# Patient Record
Sex: Female | Born: 1945
Health system: Southern US, Community
[De-identification: ages and names within clinical notes are randomized; demographics above are authoritative.]

## PROBLEM LIST (undated history)

## (undated) DIAGNOSIS — F329 Major depressive disorder, single episode, unspecified: Secondary | ICD-10-CM

## (undated) DIAGNOSIS — R51 Headache: Secondary | ICD-10-CM

## (undated) DIAGNOSIS — F32A Depression, unspecified: Secondary | ICD-10-CM

## (undated) DIAGNOSIS — M1990 Unspecified osteoarthritis, unspecified site: Secondary | ICD-10-CM

## (undated) DIAGNOSIS — K219 Gastro-esophageal reflux disease without esophagitis: Secondary | ICD-10-CM

## (undated) DIAGNOSIS — E039 Hypothyroidism, unspecified: Secondary | ICD-10-CM

## (undated) DIAGNOSIS — M797 Fibromyalgia: Secondary | ICD-10-CM

## (undated) DIAGNOSIS — F419 Anxiety disorder, unspecified: Secondary | ICD-10-CM

## (undated) DIAGNOSIS — R32 Unspecified urinary incontinence: Secondary | ICD-10-CM

## (undated) DIAGNOSIS — M549 Dorsalgia, unspecified: Secondary | ICD-10-CM

## (undated) HISTORY — PX: FRACTURE SURGERY: SHX138

## (undated) HISTORY — PX: TOTAL KNEE ARTHROPLASTY: SHX125

## (undated) HISTORY — PX: JOINT REPLACEMENT: SHX530

## (undated) HISTORY — PX: BREAST CYST ASPIRATION: SHX578

## (undated) HISTORY — PX: BREAST CYST EXCISION: SHX579

---

## 1994-02-12 HISTORY — PX: VAGINAL HYSTERECTOMY: SUR661

## 1997-08-12 ENCOUNTER — Emergency Department (HOSPITAL_COMMUNITY): Admission: EM | Admit: 1997-08-12 | Discharge: 1997-08-12 | Payer: Self-pay | Admitting: Emergency Medicine

## 1997-09-08 ENCOUNTER — Ambulatory Visit (HOSPITAL_COMMUNITY): Admission: RE | Admit: 1997-09-08 | Discharge: 1997-09-08 | Payer: Self-pay | Admitting: *Deleted

## 1997-11-17 ENCOUNTER — Ambulatory Visit: Admission: RE | Admit: 1997-11-17 | Discharge: 1997-11-17 | Payer: Self-pay

## 1997-11-19 ENCOUNTER — Emergency Department (HOSPITAL_COMMUNITY): Admission: EM | Admit: 1997-11-19 | Discharge: 1997-11-19 | Payer: Self-pay | Admitting: Emergency Medicine

## 1998-03-14 ENCOUNTER — Encounter: Payer: Self-pay | Admitting: *Deleted

## 1998-03-14 ENCOUNTER — Ambulatory Visit (HOSPITAL_COMMUNITY): Admission: RE | Admit: 1998-03-14 | Discharge: 1998-03-14 | Payer: Self-pay | Admitting: *Deleted

## 1999-02-12 ENCOUNTER — Encounter: Payer: Self-pay | Admitting: Emergency Medicine

## 1999-02-12 ENCOUNTER — Emergency Department (HOSPITAL_COMMUNITY): Admission: EM | Admit: 1999-02-12 | Discharge: 1999-02-12 | Payer: Self-pay | Admitting: Emergency Medicine

## 1999-02-23 ENCOUNTER — Encounter (INDEPENDENT_AMBULATORY_CARE_PROVIDER_SITE_OTHER): Payer: Self-pay | Admitting: Specialist

## 1999-02-23 ENCOUNTER — Ambulatory Visit (HOSPITAL_COMMUNITY): Admission: RE | Admit: 1999-02-23 | Discharge: 1999-02-23 | Payer: Self-pay | Admitting: *Deleted

## 1999-03-23 ENCOUNTER — Encounter: Payer: Self-pay | Admitting: Orthopedic Surgery

## 1999-03-23 ENCOUNTER — Ambulatory Visit (HOSPITAL_COMMUNITY): Admission: RE | Admit: 1999-03-23 | Discharge: 1999-03-23 | Payer: Self-pay | Admitting: Orthopedic Surgery

## 1999-06-30 ENCOUNTER — Encounter: Payer: Self-pay | Admitting: *Deleted

## 1999-06-30 ENCOUNTER — Encounter: Admission: RE | Admit: 1999-06-30 | Discharge: 1999-06-30 | Payer: Self-pay | Admitting: *Deleted

## 1999-12-21 ENCOUNTER — Ambulatory Visit (HOSPITAL_COMMUNITY): Admission: RE | Admit: 1999-12-21 | Discharge: 1999-12-21 | Payer: Self-pay | Admitting: Neurosurgery

## 1999-12-21 ENCOUNTER — Encounter: Payer: Self-pay | Admitting: Neurosurgery

## 2000-06-14 ENCOUNTER — Encounter: Payer: Self-pay | Admitting: Emergency Medicine

## 2000-06-14 ENCOUNTER — Emergency Department (HOSPITAL_COMMUNITY): Admission: EM | Admit: 2000-06-14 | Discharge: 2000-06-14 | Payer: Self-pay | Admitting: Emergency Medicine

## 2000-06-20 ENCOUNTER — Encounter: Admission: RE | Admit: 2000-06-20 | Discharge: 2000-06-20 | Payer: Self-pay | Admitting: Gastroenterology

## 2000-06-20 ENCOUNTER — Encounter: Payer: Self-pay | Admitting: Gastroenterology

## 2001-01-23 ENCOUNTER — Encounter: Payer: Self-pay | Admitting: Neurosurgery

## 2001-01-23 ENCOUNTER — Encounter: Admission: RE | Admit: 2001-01-23 | Discharge: 2001-01-23 | Payer: Self-pay | Admitting: Neurosurgery

## 2001-02-03 ENCOUNTER — Encounter: Payer: Self-pay | Admitting: Neurosurgery

## 2001-02-03 ENCOUNTER — Encounter: Admission: RE | Admit: 2001-02-03 | Discharge: 2001-02-03 | Payer: Self-pay | Admitting: Neurosurgery

## 2001-02-17 ENCOUNTER — Encounter: Admission: RE | Admit: 2001-02-17 | Discharge: 2001-02-17 | Payer: Self-pay | Admitting: Neurosurgery

## 2001-02-17 ENCOUNTER — Encounter: Payer: Self-pay | Admitting: Neurosurgery

## 2001-04-10 ENCOUNTER — Ambulatory Visit (HOSPITAL_COMMUNITY): Admission: RE | Admit: 2001-04-10 | Discharge: 2001-04-10 | Payer: Self-pay | Admitting: Gastroenterology

## 2001-04-10 ENCOUNTER — Encounter (INDEPENDENT_AMBULATORY_CARE_PROVIDER_SITE_OTHER): Payer: Self-pay | Admitting: Specialist

## 2001-12-11 ENCOUNTER — Ambulatory Visit: Admission: RE | Admit: 2001-12-11 | Discharge: 2001-12-11 | Payer: Self-pay | Admitting: Orthopedic Surgery

## 2002-12-28 ENCOUNTER — Inpatient Hospital Stay (HOSPITAL_COMMUNITY): Admission: RE | Admit: 2002-12-28 | Discharge: 2002-12-31 | Payer: Self-pay | Admitting: Orthopedic Surgery

## 2003-02-05 ENCOUNTER — Emergency Department (HOSPITAL_COMMUNITY): Admission: EM | Admit: 2003-02-05 | Discharge: 2003-02-05 | Payer: Self-pay | Admitting: *Deleted

## 2003-03-04 ENCOUNTER — Ambulatory Visit (HOSPITAL_COMMUNITY): Admission: RE | Admit: 2003-03-04 | Discharge: 2003-03-04 | Payer: Self-pay | Admitting: Orthopedic Surgery

## 2004-02-13 HISTORY — PX: BACK SURGERY: SHX140

## 2004-02-13 HISTORY — PX: LUMBAR DISC SURGERY: SHX700

## 2004-06-20 ENCOUNTER — Encounter: Admission: RE | Admit: 2004-06-20 | Discharge: 2004-06-20 | Payer: Self-pay | Admitting: Orthopedic Surgery

## 2004-07-04 ENCOUNTER — Encounter: Admission: RE | Admit: 2004-07-04 | Discharge: 2004-07-04 | Payer: Self-pay | Admitting: Orthopedic Surgery

## 2004-07-26 ENCOUNTER — Encounter: Admission: RE | Admit: 2004-07-26 | Discharge: 2004-07-26 | Payer: Self-pay | Admitting: Orthopedic Surgery

## 2004-12-26 ENCOUNTER — Ambulatory Visit (HOSPITAL_COMMUNITY): Admission: RE | Admit: 2004-12-26 | Discharge: 2004-12-27 | Payer: Self-pay | Admitting: Neurosurgery

## 2006-01-17 ENCOUNTER — Emergency Department (HOSPITAL_COMMUNITY): Admission: EM | Admit: 2006-01-17 | Discharge: 2006-01-18 | Payer: Self-pay | Admitting: Emergency Medicine

## 2006-05-31 ENCOUNTER — Encounter: Admission: RE | Admit: 2006-05-31 | Discharge: 2006-05-31 | Payer: Self-pay | Admitting: Orthopaedic Surgery

## 2006-06-25 ENCOUNTER — Ambulatory Visit (HOSPITAL_BASED_OUTPATIENT_CLINIC_OR_DEPARTMENT_OTHER): Admission: RE | Admit: 2006-06-25 | Discharge: 2006-06-25 | Payer: Self-pay | Admitting: Orthopaedic Surgery

## 2006-08-22 ENCOUNTER — Encounter: Admission: RE | Admit: 2006-08-22 | Discharge: 2006-08-22 | Payer: Self-pay | Admitting: Orthopaedic Surgery

## 2008-02-13 DIAGNOSIS — M1990 Unspecified osteoarthritis, unspecified site: Secondary | ICD-10-CM

## 2008-02-13 HISTORY — DX: Unspecified osteoarthritis, unspecified site: M19.90

## 2008-04-20 ENCOUNTER — Inpatient Hospital Stay (HOSPITAL_COMMUNITY): Admission: RE | Admit: 2008-04-20 | Discharge: 2008-04-23 | Payer: Self-pay | Admitting: Orthopaedic Surgery

## 2008-04-21 ENCOUNTER — Ambulatory Visit: Payer: Self-pay | Admitting: Physical Medicine & Rehabilitation

## 2009-02-16 ENCOUNTER — Encounter: Admission: RE | Admit: 2009-02-16 | Discharge: 2009-02-16 | Payer: Self-pay | Admitting: Neurosurgery

## 2009-04-21 ENCOUNTER — Encounter
Admission: RE | Admit: 2009-04-21 | Discharge: 2009-04-21 | Payer: Self-pay | Admitting: Physical Medicine & Rehabilitation

## 2009-05-06 ENCOUNTER — Inpatient Hospital Stay (HOSPITAL_COMMUNITY): Admission: RE | Admit: 2009-05-06 | Discharge: 2009-05-11 | Payer: Self-pay | Admitting: Neurosurgery

## 2009-09-16 ENCOUNTER — Encounter: Admission: RE | Admit: 2009-09-16 | Discharge: 2009-09-16 | Payer: Self-pay | Admitting: Rheumatology

## 2009-12-01 ENCOUNTER — Encounter: Admission: RE | Admit: 2009-12-01 | Discharge: 2009-12-01 | Payer: Self-pay | Admitting: Orthopedic Surgery

## 2010-03-05 ENCOUNTER — Encounter: Payer: Self-pay | Admitting: Rheumatology

## 2010-05-08 LAB — CBC
HCT: 38 % (ref 36.0–46.0)
Hemoglobin: 12.7 g/dL (ref 12.0–15.0)
MCHC: 33.3 g/dL (ref 30.0–36.0)
MCV: 87.3 fL (ref 78.0–100.0)
Platelets: 280 10*3/uL (ref 150–400)
RBC: 4.35 MIL/uL (ref 3.87–5.11)
RDW: 17.8 % — ABNORMAL HIGH (ref 11.5–15.5)
WBC: 7.4 10*3/uL (ref 4.0–10.5)

## 2010-05-08 LAB — BASIC METABOLIC PANEL
BUN: 11 mg/dL (ref 6–23)
CO2: 29 mEq/L (ref 19–32)
Calcium: 8.9 mg/dL (ref 8.4–10.5)
Chloride: 107 mEq/L (ref 96–112)
Creatinine, Ser: 0.69 mg/dL (ref 0.4–1.2)
GFR calc Af Amer: >60 mL/min (ref >60)
GFR calc non Af Amer: >60 mL/min (ref >60)
Glucose, Bld: 108 mg/dL — ABNORMAL HIGH (ref 70–99)
Potassium: 4.7 mEq/L (ref 3.5–5.1)
Sodium: 142 mEq/L (ref 135–145)

## 2010-05-08 LAB — TYPE AND SCREEN
ABO/RH(D): O POS
Antibody Screen: NEGATIVE

## 2010-05-08 LAB — SURGICAL PCR SCREEN: MRSA, PCR: NEGATIVE

## 2010-05-08 LAB — ABO/RH: ABO/RH(D): O POS

## 2010-05-12 ENCOUNTER — Other Ambulatory Visit: Payer: Self-pay | Admitting: Gastroenterology

## 2010-05-25 LAB — CBC
HCT: 31.8 % — ABNORMAL LOW (ref 36.0–46.0)
Hemoglobin: 10.6 g/dL — ABNORMAL LOW (ref 12.0–15.0)
MCHC: 35 g/dL (ref 30.0–36.0)
Platelets: 192 10*3/uL (ref 150–400)
Platelets: 283 10*3/uL (ref 150–400)
RBC: 3.25 MIL/uL — ABNORMAL LOW (ref 3.87–5.11)
RBC: 3.62 MIL/uL — ABNORMAL LOW (ref 3.87–5.11)
WBC: 10.1 10*3/uL (ref 4.0–10.5)
WBC: 10.7 10*3/uL — ABNORMAL HIGH (ref 4.0–10.5)
WBC: 6.9 10*3/uL (ref 4.0–10.5)
WBC: 7.5 10*3/uL (ref 4.0–10.5)

## 2010-05-25 LAB — BASIC METABOLIC PANEL
BUN: 12 mg/dL (ref 6–23)
BUN: 4 mg/dL — ABNORMAL LOW (ref 6–23)
CO2: 26 mEq/L (ref 19–32)
CO2: 28 mEq/L (ref 19–32)
Calcium: 8.1 mg/dL — ABNORMAL LOW (ref 8.4–10.5)
Calcium: 9.2 mg/dL (ref 8.4–10.5)
Chloride: 100 mEq/L (ref 96–112)
Creatinine, Ser: 0.83 mg/dL (ref 0.4–1.2)
GFR calc Af Amer: >60 mL/min (ref >60)
GFR calc Af Amer: >60 mL/min (ref >60)
GFR calc non Af Amer: >60 mL/min (ref >60)
GFR calc non Af Amer: >60 mL/min (ref >60)
GFR calc non Af Amer: >60 mL/min (ref >60)
Glucose, Bld: 98 mg/dL (ref 70–99)
Potassium: 4.1 mEq/L (ref 3.5–5.1)
Potassium: 5.1 mEq/L (ref 3.5–5.1)
Sodium: 134 mEq/L — ABNORMAL LOW (ref 135–145)
Sodium: 137 mEq/L (ref 135–145)
Sodium: 138 mEq/L (ref 135–145)

## 2010-05-25 LAB — PROTIME-INR
INR: 1 (ref 0.00–1.49)
INR: 1.6 — ABNORMAL HIGH (ref 0.00–1.49)
INR: 2.4 — ABNORMAL HIGH (ref 0.00–1.49)
Prothrombin Time: 13 seconds (ref 11.6–15.2)
Prothrombin Time: 13.4 seconds (ref 11.6–15.2)
Prothrombin Time: 19.7 seconds — ABNORMAL HIGH (ref 11.6–15.2)

## 2010-05-31 ENCOUNTER — Ambulatory Visit: Payer: Self-pay | Admitting: Physical Therapy

## 2010-06-07 ENCOUNTER — Ambulatory Visit: Payer: Medicare Other | Attending: Orthopedic Surgery | Admitting: Physical Therapy

## 2010-06-07 DIAGNOSIS — M25673 Stiffness of unspecified ankle, not elsewhere classified: Secondary | ICD-10-CM | POA: Insufficient documentation

## 2010-06-07 DIAGNOSIS — M25676 Stiffness of unspecified foot, not elsewhere classified: Secondary | ICD-10-CM | POA: Insufficient documentation

## 2010-06-07 DIAGNOSIS — IMO0001 Reserved for inherently not codable concepts without codable children: Secondary | ICD-10-CM | POA: Insufficient documentation

## 2010-06-07 DIAGNOSIS — R269 Unspecified abnormalities of gait and mobility: Secondary | ICD-10-CM | POA: Insufficient documentation

## 2010-06-07 DIAGNOSIS — M25579 Pain in unspecified ankle and joints of unspecified foot: Secondary | ICD-10-CM | POA: Insufficient documentation

## 2010-06-08 ENCOUNTER — Ambulatory Visit: Payer: Medicare Other | Admitting: Physical Therapy

## 2010-06-12 ENCOUNTER — Ambulatory Visit: Payer: Medicare Other | Admitting: Physical Therapy

## 2010-06-14 ENCOUNTER — Ambulatory Visit: Payer: Medicare Other | Attending: Orthopedic Surgery | Admitting: Physical Therapy

## 2010-06-14 DIAGNOSIS — IMO0001 Reserved for inherently not codable concepts without codable children: Secondary | ICD-10-CM | POA: Insufficient documentation

## 2010-06-14 DIAGNOSIS — M25676 Stiffness of unspecified foot, not elsewhere classified: Secondary | ICD-10-CM | POA: Insufficient documentation

## 2010-06-14 DIAGNOSIS — M25673 Stiffness of unspecified ankle, not elsewhere classified: Secondary | ICD-10-CM | POA: Insufficient documentation

## 2010-06-14 DIAGNOSIS — M25579 Pain in unspecified ankle and joints of unspecified foot: Secondary | ICD-10-CM | POA: Insufficient documentation

## 2010-06-14 DIAGNOSIS — R269 Unspecified abnormalities of gait and mobility: Secondary | ICD-10-CM | POA: Insufficient documentation

## 2010-06-16 ENCOUNTER — Ambulatory Visit: Payer: Medicare Other | Admitting: Rehabilitation

## 2010-06-19 ENCOUNTER — Ambulatory Visit: Payer: Medicare Other | Admitting: Physical Therapy

## 2010-06-21 ENCOUNTER — Ambulatory Visit: Payer: Medicare Other | Admitting: Physical Therapy

## 2010-06-22 ENCOUNTER — Ambulatory Visit: Payer: Medicare Other | Admitting: Rehabilitation

## 2010-06-23 ENCOUNTER — Encounter: Payer: BC Managed Care – HMO | Admitting: Rehabilitation

## 2010-06-26 ENCOUNTER — Ambulatory Visit: Payer: Medicare Other | Admitting: Physical Therapy

## 2010-06-27 NOTE — Discharge Summary (Signed)
[name], [name]             ACCOUNT NO.:  [id number]   MEDICAL RECORD NO.:  [id number]          PATIENT TYPE:  INP   LOCATION:  5015                         FACILITY:  [facility]   PHYSICIAN:  Lubertha Basque. Dalldorf, M.D.DATE OF BIRTH:  1945/12/20   DATE OF ADMISSION:  04/20/2008  DATE OF DISCHARGE:  04/23/2008                               DISCHARGE SUMMARY   ADMITTING DIAGNOSES:  1. Left knee end-stage degenerative joint disease.  2  Gastroesophageal reflux disease.  1. Hypothyroidism.   DISCHARGE DIAGNOSES:  1. Left knee end-stage degenerative joint disease.  2. Gastroesophageal reflux disease.  3. Hypothyroidism.   OPERATIONS:  Left total knee replacement.   BRIEF HISTORY:  Ms. Lisa Duke is a 65 year old white female patient well  known to our practice who has had increasing left knee pain.  She is now  having pain with every step, trouble sleeping at nighttime, and trouble  being comfortable.  She has failed anti-inflammatory medications and  nonsteroidal anti-inflammatory injections, and her x-rays reveal end-  stage DJD.   PERTINENT LABORATORY AND X-RAY FINDINGS:  Lab work:  Hemoglobin 10.6,  hematocrit of 30.5, WBC is 6.9, and platelets 201.  Sodium 137,  potassium 3.6, BUN 4, creatinine 0.59, and glucose 101.  Last INR 2.4.   COURSE IN THE HOSPITAL:  She was admitted postoperatively, placed on a  variety of p.o. and IM analgesics for pain, a reduced dose PCA Dilaudid  pump was used.  She was also given IV Ancef a gram q.8 h. x3 doses and  advanced her diet slowly and regular.  Coumadin and Lovenox protocol per  Pharmacy.  DVT prophylaxis.  Then additional p.o. medicines, pain  medicines, iron supplementation, and stool softeners.  Knee high TEDs,  incentive spirometry, and then increased activity with therapy.  Weightbearing as tolerated were also incorporated.  She was kept on her  home medicines, which will be outlined at the end of this dictation.  CPM machine was  also used, 0-15 and advanced as tolerated.   The first day postoperative, blood pressure was 116/68, temperature 97.  Lungs were clear.  Abdomen was soft.  Left knee was within normal  limits.  Hemoglobin 12.0.  On the second day postoperative, dressing was  changed, drained was pulled, and the wound was noted to be benign  without signs of infection.  She was working well with therapy,  progressed, and was discharged the third day postop.  Condition on  discharge is improved.   FOLLOWUP:  She will see Dr. Jerl Santos in 2 weeks for staple removal.  May  change her dressing daily.  Low-sodium and heart-healthy diet.  Weightbearing as tolerated with crutches or walker.  Home agency to  provide home therapy, a CPM machine, and INRs for Coumadin dosing.  She  was given a prescription for Percocet 1-2 every 4-6  hours for pain, and will be kept on her home medicines, which are  Protonix 80 mg 1 a day, Prozac 40 mg 1 a day, Oxytrol 3.9 patch daily,  Sectral 400 mg nightly, Pamelor 50 mg nightly, Desyrel 150 mg nightly,  and Klonopin  2 mg t.i.d.      Lindwood Qua, P.A.      Lubertha Basque Jerl Santos, M.D.  Electronically Signed    MC/MEDQ  D:  04/23/2008  T:  04/23/2008  Job:  [id number]
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
## 2010-06-27 NOTE — Op Note (Signed)
Lisa Duke, Lisa Duke             ACCOUNT NO.:  [id number]   MEDICAL RECORD NO.:  [id number]          PATIENT TYPE:  INP   LOCATION:  5015                         FACILITY:  [facility]   PHYSICIAN:  Lubertha Basque. Dalldorf, M.D.DATE OF BIRTH:  1945/04/05   DATE OF PROCEDURE:  04/20/2008  DATE OF DISCHARGE:                               OPERATIVE REPORT   PREOPERATIVE DIAGNOSIS:  Left knee degenerative arthritis.   POSTOPERATIVE DIAGNOSIS:  Left knee degenerative arthritis.   PROCEDURE:  Left total knee replacement.   ANESTHESIA:  General and block.   ATTENDING SURGEON:  Lubertha Basque. Jerl Santos, MD   ASSISTANT:  Lindwood Qua, PA   INDICATIONS FOR PROCEDURE:  The patient is a 65 year old woman with a  long history of left knee pain.  This has persisted despite oral anti-  inflammatories and injections.  She has bone-on-bone degeneration seen  on x-ray and pain which limits her ability to walk and rest.  She is  offered a knee replacement operation.  She is status post a successful  procedure on the opposite side many years back.  Informed operative  consent was obtained after discussion of possible complications  including reaction to anesthesia, infection, DVT, PE, and death.   SUMMARY OF FINDINGS AND PROCEDURE:  Under general anesthesia and a knee  block, a left knee replacement was performed.  She had advanced  degenerative change but good bone quality.  We addressed her problem  with a cemented DePuy LCS system using standard plus femur, 4 tibia, 10-  mm deep dish spacer, and 35-mm all polyethylene patella.  These were  parts roughly the same as the opposite side.  We did include Zinacef  antibiotic in the cement.  Bryna Colander assisted throughout and was  invaluable to the completion of the case in that he helped position and  retract so I could perform the procedure.  He also closed simultaneously  to help minimize OR time.   DESCRIPTION OF PROCEDURE:  The patient was taken to the  operating suite  where general anesthetic was applied without difficulty.  She was also  given a block in the preanesthesia area.  She was positioned supine and  prepped and draped in normal sterile fashion.  After the administration  of IV Kefzol, the left leg was elevated, exsanguinated, and the  tourniquet inflated about the thigh.  A longitudinal anterior incision  was made with dissection down to the extensor mechanism.  All  appropriate anti-infected measures were used including closed hooded  exhaust systems for each member of the surgical team, Betadine  impregnated drape, and the preoperative IV antibiotic.  A medial  parapatellar incision was made.  The kneecap was flipped and the knee  was flexed.  Some residual meniscal tissues in the ACL and PCL were  removed.  An extramedullary guide was placed on the tibia and utilized  to make a cut with a slight posterior tilt.  An intramedullary guide was  then placed in the femur to make anterior-posterior cuts creating a  flexion gap of 10 mm.  A second intramedullary guide was placed in the  femur to make a distal cut creating an equal extension gap of 10 mm  balancing the knee.  No significant soft tissue release was required.  The femur sized to a standard plus and the tibia to a 4 with appropriate  guides placed and utilized.  The patella was cut down in thickness by 10  mm to 15 and sized to a 35 with the appropriate guide placed and  utilized.  A trial reduction was done with these components and a 10 mm  spacer.  She easily came to slight hyperextension and flexed well.  The  patella tracked well.  The trial components removed followed by  pulsatile lavage irrigation of all three cut bony surfaces.  Cement was  mixed including Zinacef antibiotic and was pressurized onto the bones  followed by placement of the aforementioned DePuy LCS components.  Excess cement was trimmed and pressure was held in the components until  the cement  had hardened.  The tourniquet was deflated and a small amount  of bleeding was easily controlled with Bovie cautery.  The knee was  irrigated followed by placement of a drain exiting superolaterally.  The  extensor mechanism was reapproximated with #1 Vicryl in an interrupted  fashion followed by subcutaneous reapproximation with 0 and 2-0 undyed  Vicryl.  Skin was closed with staples.  Adaptic was applied followed by  dry gauze and a loose Ace wrap.  Estimated blood loss was minimal and  intraoperative fluids can be obtained from anesthesia records as can  accurate tourniquet time which was under 1 hour.   DISPOSITION:  The patient was extubated in the operating room and taken  to recovery room in stable addition.  She was to be admitted to the  Orthopedic Surgery Service for appropriate postop care to include  perioperative antibiotics and Coumadin plus Lovenox for DVT prophylaxis.      Lubertha Basque Jerl Santos, M.D.  Electronically Signed     PGD/MEDQ  D:  04/20/2008  T:  04/21/2008  Job:  [id number]

## 2010-06-28 ENCOUNTER — Ambulatory Visit: Payer: Medicare Other | Admitting: Physical Therapy

## 2010-06-29 ENCOUNTER — Ambulatory Visit: Payer: Medicare Other | Admitting: Physical Therapy

## 2010-06-30 ENCOUNTER — Encounter: Payer: BC Managed Care – HMO | Admitting: Physical Therapy

## 2010-06-30 ENCOUNTER — Encounter: Payer: BC Managed Care – HMO | Admitting: Rehabilitation

## 2010-06-30 NOTE — Op Note (Signed)
NAMECLAIR, Lisa Duke             ACCOUNT NO.:  [id number]   MEDICAL RECORD NO.:  [id number]          PATIENT TYPE:  AMB   LOCATION:  SDS                          FACILITY:  [facility]   PHYSICIAN:  Hilda Lias, M.D.   DATE OF BIRTH:  20-Nov-1945   DATE OF PROCEDURE:  12/26/2004  DATE OF DISCHARGE:                                 OPERATIVE REPORT   PREOPERATIVE DIAGNOSES:  1.  Right L2-L3 herniated disc with extraforaminal component.  2.  Degenerative disc disease 4-5, 5-1.   POSTOPERATIVE DIAGNOSES:  1.  Right L2-L3 herniated disc with extraforaminal component.  2.  Degenerative disc disease 4-5, 5-1.   PROCEDURES:  1.  Right L2-L3 extraforaminal decompression.  2.  Discectomy.  3.  Decompression of the L2 nerve root.  4.  Microscope.   SURGEON:  Hilda Lias, M.D.   ASSISTANT:  Payton Doughty, M.D.   CLINICAL HISTORY:  The patient was admitted because of back and right leg  pain.  The pain mostly was localized to the thigh.  Patient had burning  sensation in that area.  MRI showed degenerative disc disease but at the  level 2-3 she has a large herniated disc extraforaminal.  Surgery was  advised and the risks were explained in the history and physical.   DESCRIPTION OF PROCEDURE:  The patient was taken to the OR and she was  positioned in prone manner.  The back was prepped.  X-rays showed that we  were in the lower part of L3.  From then on, incision was made at the level  of L2-L3.  Muscle was retracted laterally.  The first x-ray showed that we  were at the level of L1-L2.  From then on, we went extraforaminal laterally  and we found the L2-L3 space on the right side.  We drilled the lower  transverse process of L2, the upper of L3 and followed the medial facet.  With the microscope, we removed the intertransverse ligament.  Immediately  we found that the nerve was yellow, really white, and attached to the disc.  It took [place] about 10 minutes to make the space between the  disc and the  nerve.  At the end, we were able to retract the nerve and we removed not  only the extraforaminal of the component of the disc but also the  intraforaminal.  Total discectomy from the lateral approach was accomplished  with plenty of room for the L2.  At the end, the nerve came  back to be cylinder-like.  __________ was negative.  After having good  decompression, the area was irrigated.  Valsalva maneuver was negative.  Fentanyl and Depo-Medrol were left in the epidural space and the wound was  closed with Vicryl __________.           ______________________________  Hilda Lias, M.D.     EB/MEDQ  D:  12/26/2004  T:  12/26/2004  Job:  [id number]

## 2010-06-30 NOTE — Procedures (Signed)
[facility]  Patient:    Lisa Duke, Lisa Duke Visit Number: [id number] MRN: [id number]          Service Type: END Location: ENDO Attending Physician:  Nelda Marseille Dictated by:   Petra Kuba, M.D. Proc. Date: 04/10/01 Admit Date:  04/10/2001   CC:         Al Decant. Janey Greaser, M.D.  Maretta Bees. Vonita Moss, M.D.   Procedure Report  PROCEDURE:  Colonoscopy with polypectomy.  INDICATIONS FOR PROCEDURE:  Family history of colon cancer, colon polyps.  Consent was signed after risks, benefits, methods, and options were thoroughly discussed in the office.  MEDICINES USED:  Demerol 120, Versed 12.  DESCRIPTION OF PROCEDURE:  Rectal inspection was pertinent for external hemorrhoids, small. Digital exam was negative. The video pediatric adjustable colonoscope was inserted and fairly easily advanced around the colon to the cecum. This did require rolling her on her back and some abdominal pressure. On insertion, some left sided diverticula were seen but no other abnormalities. The cecum was identified by the appendiceal orifice and the ileocecal valve. The prep was adequate. There was some liquid stool that required washing and suctioning. The scope was slowly withdrawn. The cecum, ascending and transverse were normal. In the descending and two in the distal sigmoid tiny polyps were seen and were all hot biopsied x1 and put in the same container. There was an occasional left sided diverticula and the sigmoid was tortuous. No other abnormalities were seen as we slowly withdrew back to the rectum. Once back in the rectum, the scope was retroflexed pertinent for some internal hemorrhoids. The scope was straightened, air was suctioned, scope removed. The patient tolerated the procedure well. There was no obvious or immediate complications.  ENDOSCOPIC DIAGNOSIS:  1. Internal/external hemorrhoids.  2. Three tiny left sided polyps, 2 in the sigmoid, 1 in the  descending, all     hot biopsied.  3. Occasional left sided diverticula and tortuosity.  4. Otherwise within normal limits to the cecum.  PLAN:  GI follow-up p.r.n. or in six months to recheck symptoms and make sure no further workup plans are needed. Otherwise await pathology. Would probably recheck colon screening in five years otherwise return care to Dr. Janey Greaser for the customary health care maintenance to include yearly rectals and guaiacs. ictated by:   Petra Kuba, M.D. Attending Physician:  Nelda Marseille DD:  04/10/01 TD:  04/10/01 Job: [id number]

## 2010-06-30 NOTE — H&P (Signed)
NAME:  Lisa Duke, Lisa Duke                       ACCOUNT NO.:  [id number]   MEDICAL RECORD NO.:  [id number]                   PATIENT TYPE:  INP   LOCATION:  NA                                   FACILITY:  [facility]   PHYSICIAN:  John L. Rendall, M.D.               DATE OF BIRTH:  [birth year]   DATE OF ADMISSION:  12/28/2002  DATE OF DISCHARGE:                                HISTORY & PHYSICAL   CHIEF COMPLAINT:  Right knee pain.   HISTORY OF PRESENT ILLNESS:  Ms. Lisa Duke is a 65 year old white female with  a four to five year history of right knee pain.  Her right knee pain is  constant, shooting pain that she states ranks with labor pain.  Pain  radiates from her right knee to her posterior right thigh at the lateral  posterior side of her distal tibia.  The patient states she becomes very  stiff whenever she sits for prolonged periods of time.  She has pain in her  right knee with walking and going up and down stairs.  She has failed  conservative treatment.  She has also undergone two right knee arthroscopies  in the past by John L. Rendall, M.D.  Her mechanical symptoms include giving  way sensation, painful coughing and catching and locking sensation.  The  pain from her right knee does wake her at night.  She uses OxyContin and  Percocet for pain and this gives her good relief.  NSAIDs in the past had  given her no relief.  The patient therefore to undergo right total knee  arthroplasty on December 28, 2002 at [facility].   ALLERGIES:  None.   MEDICATIONS:  1. Synthroid 125 mg one p.o. daily.  2. Nexium 40 mg daily.  3. Klonopin 2 mg b.i.d.  4. Prozac 10 mg one q.a.m.  5. Sectral 400 mg p.o. one q.p.m.  6. Nortriptyline 25 mg p.o. q.p.m.  7. Oxytrol patches 3.9 mg one every three and a half days.   PAST MEDICAL HISTORY:  1. Hypothyroidism.  2. GERD.  3. Asthma.  4. Urinary incontinence.  5. History of ________ bladder.   PAST SURGICAL HISTORY:  1. Bladder  sling 1998.  2. Left knee scope 2000.  3. Right knee scope 2001.  4. Right knee scope 2002.  5. Trigger finger release on the left 2002.  6. Hand surgery 2004.  7. She also notes that she had an I&D of a brown recluse spider bite in the     past.  8. The patient states she had no complications with the above procedures and     received no blood products.   SOCIAL HISTORY:  Denies any tobacco or alcohol use.  She is married.  Has  two grown sons.  Primary care physician is Al Decant. Janey Greaser, MD [phone]).  She lives in a two-story home with four steps to the  usual entrance.  Work  history:  She is a retired Engineer, site.   FAMILY HISTORY:  Mother and father are alive and no known health problems.  She does have three brothers who have no listed health issues.   REVIEW OF SYSTEMS:  The patient denies any chest pain, shortness of breath,  or PND.  She has bouts with reflux and constipation.  She also has a history  of cervical disk and lumbar herniation.  She wears glasses only for reading.  Otherwise, review of systems is negative.   PHYSICAL EXAMINATION:  GENERAL:  The patient is a well-developed, well-  nourished female who walks with an antalgic gait on the left.  Her mood and  affect are appropriate.  She talks easily with examiner.  VITAL SIGNS:  The patient's height is 5 feet 5-1/2 inches.  Weight is 161.4  pounds.  Temperature 98 degrees Fahrenheit, pulse 64, blood pressure 120/70,  respiratory rate 16.  HEENT:  Normocephalic, atraumatic with frontal maxillary sinus tenderness to  palpation.  Sclerae are nonicteric bilaterally.  PERRLA.  EOMs are intact.  Conjunctivae pink.  There is no visible external ear deformities.  Left TM  is pearly and grey.  Right TM is occluded by cerumen.  Nasal mucosa is pink  and moist and without polyps.  Buccal mucosa is pink and moist.  Good  dentition.  Pharynx without erythema or exudate.  Tongue and uvula are  midline.  CARDIAC:   Regular rate and rhythm.  No murmurs, rubs, or gallops heard.  LUNGS:  Clear to auscultation bilaterally.  NECK:  Supple.  Carotids are 2+ bilaterally without bruits.  Full range of  motion of the cervical spine with no tenderness.  ABDOMEN:  Positive bowel sounds x4 quadrants.  Nontender throughout.  BACK:  Nontender to palpation over the thoracic and lumbar spine.  BREAST:  Deferred at this time.  GENITALIA:  Deferred at this time.  URINARY:  Deferred at this time.  RECTAL:  Deferred at this time.  NEUROLOGIC:  The patient is alert and oriented x3.  Cranial nerves are  grossly intact.  Strength testing in the upper and lower extremities reveals  bilateral 5/5 strength.  Deep tendon reflexes are bilaterally equal and  symmetric in the upper and lower extremities.  MUSCULOSKELETAL:  Upper extremities reveal full range of motion throughout  the upper extremities bilaterally.  Radial pulses are 2+ and are equal and  symmetric.  She does have some pain in her upper extremities in the area of  the trapezius and upper parathoracic area.  Right hand carpal tunnel  incision is well healed and benign.  She still has some residual numbness in  the fourth and third fingers.  Lower extremities:  Full range of motion of  both hips.  Straight-leg raise is negative bilaterally.  Left knee:  Full  range of motion.  Reveals no laxity.  McMurray's is negative.  No tenderness  in either the lateral or medial compartment.  Right knee bilateral  compartment pain to palpation.  McMurray's positive for pain.  Valgus varus  stress reveals no laxity.  Range of motion is 0-120 degrees.  Force flexion  is painful.  There is mild crepitus with passive range of motion.  Lower  extremities nonedematous.  Dorsal pedal pulses are 2+.  EHL and FHL are  intact bilaterally.   LABORATORIES:  X-rays right knee show significant compartmental narrowing and some cystic changes in the lateral femoral condyles.  IMPRESSION:  1. Near end-stage osteoarthritis right knee.  2. Hypothyroidism.  3. Gastroesophageal reflux disease.  4. Urinary incontinence.  5. Asthma.  6. Seasonal allergies.   PLAN:  The patient will be admitted to [facility] on November 15  and undergo a right total knee arthroplasty by Jonny Ruiz L. Rendall, M.D.     Richardean Canal, P.A.                       John L. Priscille Kluver, M.D.    GC/MEDQ  D:  12/22/2002  T:  12/22/2002  Job:  [id number]

## 2010-06-30 NOTE — Op Note (Signed)
NAME:  Lisa Duke, Lisa Duke                       ACCOUNT NO.:  [id number]   MEDICAL RECORD NO.:  [id number]                   PATIENT TYPE:  INP   LOCATION:  0003                                 FACILITY:  [facility]   PHYSICIAN:  John L. Rendall III, M.D.           DATE OF BIRTH:  05/08/1945   DATE OF PROCEDURE:  12/28/2002  DATE OF DISCHARGE:                                 OPERATIVE REPORT   PREOPERATIVE DIAGNOSIS:  Osteoarthritis, right knee.   POSTOPERATIVE DIAGNOSIS:  Osteoarthritis, right knee.   PROCEDURE:  Right LCS total knee replacement.   SURGEON:  John L. Rendall, M.D.   ASSISTANT:  Legrand Pitts. Duffy, P.A.-C.   ANESTHESIA:  Spinal.   INDICATIONS FOR PROCEDURE:  The patient has had a 4 to 5 history of right  knee pain which is constant, shooting, worsening with progressive  contracture. She has had an arthroscopy and conservative measures.   DESCRIPTION OF PROCEDURE:  Under spinal anesthesia the right knee was  prepared with Duraprep and draped as a sterile field. It is wrapped out with  an Esmarch and a sterile tourniquet is elevated at 350 mmHg.   A midline incision was made. The patella was everted. The femur is sized; it  is standard plus. Using the tibial guide a proximal tibial resection was  carried out using the 1st femoral guide. An intercondylar drill hole is made  using the 2nd femoral guide. The anterior and posterior flare of the distal  femur are resected and the flexion gap is balanced at 10 mm. The  intramedullary guide is then used, and after several cuts, a good 10-mm  extension gap is obtained. A recessing guide is then used.   Following this a lamina spreader is inserted. Remnants of the cruciates and  menisci and spurs off the back of the femoral condyles are resected. It is  necessary to do quite a release in the back to bring the knee out straight.   Following this McHale and Hohmann's were inserted. The tibia was sized to a  #3. A center peg  hole with keel were inserted. The #10 bearing was inserted,  standard plus femur. The patella was osteotomized and 3 peg holes inserted.  Trial components were then fully inserted. The knee was stable through full  range of motion with excellent fit and alignment.   The knee surfaces were then prepared with pulse irrigation and antibiotic  solution while cement was mixed. All components were cemented in place. Once  the cement hardened, excess was removed and the tourniquet was let down at  almost precisely 1 hour. Multiple small vessels were cauterized.   The knee was then closed in layers with #1Tycron, 0 and 2-0 Vicryl and skin  clips. A sterile dressing was applied. The patient returned to recovery in  good condition.  John L. Dorothyann Gibbs, M.D.    Renato Gails  D:  12/28/2002  T:  12/28/2002  Job:  [id number]

## 2010-06-30 NOTE — H&P (Signed)
NAMEEUREKA, Lisa Duke             ACCOUNT NO.:  [id number]   MEDICAL RECORD NO.:  [id number]          PATIENT TYPE:  AMB   LOCATION:  SDS                          FACILITY:  [facility]   PHYSICIAN:  Hilda Lias, M.D.   DATE OF BIRTH:  05/21/1945   DATE OF ADMISSION:  12/26/2004  DATE OF DISCHARGE:                                HISTORY & PHYSICAL   Lisa Duke is a lady who was seen by me on September 29 this year  complaining of back pain with radiation down to her right leg that has been  going on for several months.  The pain goes to the groin, sometimes going to  the right foot and now she has some tenderness in the right foot.  She  denies any pain in the left leg.  Patient has had conservative treatment  including epidural injection without any improvement.  Patient had MRI and  she was sent to [place] for evaluation.  When I saw her she had been going to her  surgeon and now she called the office here because she was getting worse.   PAST MEDICAL HISTORY:  1.  Hysterectomy.  2.  Bladder surgery.  3.  Knee replacement.   She is allergic to SODIUM PENTOTHAL.   SOCIAL HISTORY:  She drinks.  She does not smoke.   FAMILY HISTORY:  Unremarkable.   REVIEW OF SYSTEMS:  Positive for urinary incontinence, back pain, leg pain,  thyroid disease.   PHYSICAL EXAMINATION:  GENERAL:  Patient came to my office with her mother.  She had difficulty standing and sitting.  HEENT:  Normal.  NECK:  Normal.  LUNGS:  Clear.  HEART:  Normal.  ABDOMEN:  Normal.  EXTREMITIES:  Scar on the knee from previous surgery.  NEUROLOGIC:  She has tenderness in the right ankle.  It was difficult to  assess any weakness because of the amount of pain that she was having.  Coordination normal.  Reflexes symmetrical.   The lumbar spine x-ray showed degenerative disk disease in multiple levels.  The MRI showed that she has a herniated disk at the level L2-3 central to  the right side.   CLINICAL  IMPRESSION:  1.  Right L2 and 3 herniated disk.  2.  Degenerative disk disease L4-5, L5-S1.   RECOMMENDATIONS:  The patient realizes that she was ___________ called the  office, wanted to proceed with surgery.  The procedure will be a right L2-3  diskectomy and the risks were explained to her including the possibility of  infection, CSF leak, worsening pain, paralysis, need for further surgery.           ______________________________  Hilda Lias, M.D.     EB/MEDQ  D:  12/26/2004  T:  12/26/2004  Job:  [id number]

## 2010-06-30 NOTE — Discharge Summary (Signed)
NAME:  Lisa Duke, Lisa Duke                       ACCOUNT NO.:  [id number]   MEDICAL RECORD NO.:  [id number]                   PATIENT TYPE:  INP   LOCATION:  0463                                 FACILITY:  [facility]   PHYSICIAN:  John L. Rendall, M.D.               DATE OF BIRTH:  [birth year]   DATE OF ADMISSION:  12/28/2002  DATE OF DISCHARGE:  12/31/2002                                 DISCHARGE SUMMARY   ADMISSION DIAGNOSES:  1. End-stage osteoarthritis right knee.  2. Hypothyroidism.  3. Gastroesophageal reflux disease.  4. Asthma.  5. Urinary incontinence.  6. Seasonal allergies.   DISCHARGE DIAGNOSES:  1. End-stage osteoarthritis right knee, status post right total knee     arthroplasty.  2. Acute blood loss anemia secondary to surgery.  3. Mild sore throat.  4. Hypothyroidism.  5. Gastroesophageal reflux disease.  6. Asthma.  7. Urinary incontinence.  8. Seasonal allergies.   SURGICAL PROCEDURE:  On December 28, 2002, Ms. Romanek underwent a right  total knee arthroplasty by Dr. Jonny Ruiz L. Rendall, assisted by Arnoldo Morale,  P.A.C.  She had a DePuy MBT Kiel tibial tray cemented, size 3, with an LTC  complete RP insert size standard plus 10-mm thickness.  An LCS complete  primary femoral cemented component, size standard plus right, with an LCS  complete metal-backed patella cemented, size standard plus.   COMPLICATIONS:  None.   CONSULTS:  1. Physical therapy consult, December 29, 2002.  2. Occupational therapy and case management consult, December 30, 2002.   HISTORY OF PRESENT ILLNESS:  This 65 year old white female patient presented  to Dr. Priscille Kluver with a four- to five-year history of right knee pain.  The  pain is constant and shooting from the right knee all the way up to her  posterior thigh and the lateral aspect of her distal tibia.  She complains  of stiffness in the knee and difficulty with ambulating.  She has failed  conservative treatment and, because of that,  she is presenting for a right  knee replacement.   HOSPITAL COURSE:  Ms. Sianez tolerated the surgical procedure well and  without immediate postoperative complications.  She was transferred to [address].  She had some difficulty with pain control initially when her spinal  anesthesia wore off, but it became better controlled during the night with  Dilaudid PCA and Percocet.  On postoperative day #1 t-max was 99.4, vital  signs were stable.  Hemoglobin 11.2, hematocrit 32.3.  Urinalysis showed  negative nitrite, many bacteria, 11-20 white cells, and small leukocyte  esterase.  She was continued on Ancef for another 24 hours.  Right knee  dressing was intact without drainage, and leg was neurovascularly intact.  She was started on physical therapy per protocol.   She did well over the next several days.  Her temperature remained low  initially.  Hemoglobin remained stable at 10.3 with hematocrit of 29.7.  She  made good progress with therapy.   On December 31, 2002, she did complain of a sore throat on awakening this  morning, which improved somewhat with warm salt water gargles.  She did have  a temperature of 101.2 the night before, but she was 99.7 that morning.  Vital signs were otherwise stable.  Hemoglobin 9.6, hematocrit 27.6, white  count 8.2, and platelets 214.  Right knee incision well approximated with  staples.  Negative Homans' sign.  Pharynx is mildly red but no plaques or  exudates noted.  If her temperature remains low today and her sore throat  continues to improve, she will be discharged home later today.   DISCHARGE INSTRUCTIONS:   DIET:  She can continue her prehospitalization diet.   MEDICATIONS:  1. She may resume her prehospitalization medications, which include:     A. Synthroid 125 mcg, 1 tablet p.o. daily.     B. Nexium 40 mg p.o. daily.     C. Klonopin 2 mg p.o. b.i.d.     D. Prozac 10 mg p.o. q.a.m.     E. Sectral 400 mg p.o. q.p.m.     F. Nortriptyline  25 mg p.o. q.p.m.     G. Oxytrol patch 3.9 mg, apply 1 every 3-1/2 days.  2. Additional medications include:     A. Arixtra 2.5 mg subcutaneous once a day at 8 p.m., with her last dose        on January 03, 2003.  On January 04, 2003, she is to start 1 baby        aspirin a day for one month.     B. OxyContin 10 mg, 1 tablet p.o. q.12h.  Do not crush or chew tablets,        22 with no refill.     C. Percocet 5/325 mg, 1-2 tablets p.o. q.4h. p.r.n. for pain, 50 with no        refill.   ACTIVITY:  She is to be out of bed, weightbearing as tolerated on the right  leg with use of the walker.  She is arranged for home CPM 0-100 degrees six  to eight hours a day and arranged for home health physical therapy per  [facility]   WOUND CARE:  Please keep the right knee incision clean and dry.  She may  shower when no drainage from the wound for two days.  She is to notify Dr.  Priscille Kluver if temperature greater than or equal to 101.5 degrees Fahrenheit,  chills, pain unrelieved by pain medications, or foul-smelling drainage from  the wound.   FOLLOW-UP:  She need to follow up with Dr. Priscille Kluver in our office in  approximately 10-12 days and needs to call [phone] for that appointment.   LABORATORY DATA:  December 29, 2002, hemoglobin 11.2, hematocrit 32.3.  On  December 30, 2002, hemoglobin 10.3, hematocrit 29.7.  On December 31, 2002,  white count 8.2, hemoglobin 9.6, hematocrit 27.6, and platelets 214.   On December 24, 2002, PTT was 40.   On December 29, 2002, glucose 120, BUN 5, calcium 7.9.  On December 30, 2002, glucose 113, BUN 5, creatinine 0.7, and calcium 7.7.   On December 28, 2002, she had cloudy yellow urine, small leukocyte esterase,  11-20 clumped cells, and may bacteria.  On December 24, 2002, urinalysis  showed cloudy yellow urine, negative nitrate, moderate leukocyte esterase, 7- 10 white cells, and many bacteria.  All other laboratory studies were  within   normal limits.     Legrand Pitts Duffy, P.A.                      John L. Priscille Kluver, M.D.    KED/MEDQ  D:  12/31/2002  T:  12/31/2002  Job:  [id number]   cc:   Al Decant. Janey Greaser, MD  [address]  [place]  [place] [address]  Fax: [phone]

## 2010-07-03 ENCOUNTER — Ambulatory Visit: Payer: Medicare Other | Admitting: Physical Therapy

## 2010-07-04 ENCOUNTER — Ambulatory Visit: Payer: Medicare Other | Admitting: Physical Therapy

## 2010-07-06 ENCOUNTER — Encounter: Payer: BC Managed Care – HMO | Admitting: Physical Therapy

## 2010-07-06 ENCOUNTER — Ambulatory Visit: Payer: Medicare Other | Admitting: Rehabilitation

## 2010-07-11 ENCOUNTER — Ambulatory Visit: Payer: Medicare Other | Admitting: Physical Therapy

## 2010-07-13 ENCOUNTER — Ambulatory Visit: Payer: Medicare Other | Admitting: Physical Therapy

## 2010-07-14 ENCOUNTER — Encounter: Payer: BC Managed Care – HMO | Admitting: Physical Therapy

## 2010-07-17 ENCOUNTER — Ambulatory Visit: Payer: Medicare Other | Attending: Orthopedic Surgery | Admitting: Physical Therapy

## 2010-07-17 DIAGNOSIS — IMO0001 Reserved for inherently not codable concepts without codable children: Secondary | ICD-10-CM | POA: Insufficient documentation

## 2010-07-17 DIAGNOSIS — M25579 Pain in unspecified ankle and joints of unspecified foot: Secondary | ICD-10-CM | POA: Insufficient documentation

## 2010-07-17 DIAGNOSIS — M25673 Stiffness of unspecified ankle, not elsewhere classified: Secondary | ICD-10-CM | POA: Insufficient documentation

## 2010-07-17 DIAGNOSIS — M25676 Stiffness of unspecified foot, not elsewhere classified: Secondary | ICD-10-CM | POA: Insufficient documentation

## 2010-07-17 DIAGNOSIS — R269 Unspecified abnormalities of gait and mobility: Secondary | ICD-10-CM | POA: Insufficient documentation

## 2010-07-20 ENCOUNTER — Ambulatory Visit: Payer: Medicare Other | Admitting: Physical Therapy

## 2010-07-24 ENCOUNTER — Ambulatory Visit: Payer: Medicare Other | Admitting: Physical Therapy

## 2010-07-27 ENCOUNTER — Ambulatory Visit: Payer: Medicare Other | Admitting: Physical Therapy

## 2010-08-01 ENCOUNTER — Ambulatory Visit: Payer: Medicare Other | Admitting: Physical Therapy

## 2010-08-03 ENCOUNTER — Ambulatory Visit: Payer: Medicare Other | Admitting: Physical Therapy

## 2010-08-07 ENCOUNTER — Ambulatory Visit: Payer: Medicare Other | Admitting: Physical Therapy

## 2010-08-09 ENCOUNTER — Ambulatory Visit: Payer: Medicare Other | Admitting: Physical Therapy

## 2011-04-23 ENCOUNTER — Encounter (HOSPITAL_COMMUNITY)
Admission: RE | Admit: 2011-04-23 | Discharge: 2011-04-23 | Disposition: A | Discharge status: Home / Self Care | Payer: Medicare Other | Source: Ambulatory Visit | Attending: Rheumatology | Admitting: Rheumatology

## 2011-04-23 DIAGNOSIS — M069 Rheumatoid arthritis, unspecified: Secondary | ICD-10-CM | POA: Insufficient documentation

## 2011-05-02 NOTE — Progress Notes (Signed)
Called Dr Nickola Major office requesting PPD skin results. Left message that if PPD was not done, please send order for Quant gold assay.

## 2011-05-07 ENCOUNTER — Encounter (HOSPITAL_COMMUNITY)
Admission: RE | Admit: 2011-05-07 | Discharge: 2011-05-07 | Disposition: A | Discharge status: Home / Self Care | Payer: Medicare Other | Source: Ambulatory Visit | Attending: Rheumatology | Admitting: Rheumatology

## 2011-05-07 ENCOUNTER — Encounter (HOSPITAL_COMMUNITY): Payer: Self-pay

## 2011-05-07 HISTORY — DX: Headache: R51

## 2011-05-07 HISTORY — DX: Gastro-esophageal reflux disease without esophagitis: K21.9

## 2011-05-07 HISTORY — DX: Fibromyalgia: M79.7

## 2011-05-07 HISTORY — DX: Unspecified osteoarthritis, unspecified site: M19.90

## 2011-05-07 MED ORDER — SODIUM CHLORIDE 0.9 % IV SOLN
250.0000 mL | INTRAVENOUS | Status: DC
  Administered 2011-05-07: 250 mL via INTRAVENOUS

## 2011-05-07 MED ORDER — SODIUM CHLORIDE 0.9 % IV SOLN
3.0000 mg/kg | INTRAVENOUS | Status: DC
  Administered 2011-05-07: 300 mg via INTRAVENOUS
  Filled 2011-05-07 (×24): qty 30

## 2011-05-07 NOTE — Discharge Instructions (Signed)
Call MD for Any problems Return to Short Stay  Apr 8 at 1030                                    May 6 at 1030     Infliximab injection What is this medicine? INFLIXIMAB (in FLIX i mab) is used to treat Crohn's disease and ulcerative colitis. It is also used to treat ankylosing spondylitis, psoriasis, and some forms of arthritis. This medicine may be used for other purposes; ask your health care provider or pharmacist if you have questions. What should I tell my health care provider before I take this medicine? They need to know if you have any of these conditions: -diabetes -exposure to tuberculosis -heart failure -hepatitis or liver disease -immune system problems -infection -lung or breathing disease, like COPD -multiple sclerosis -current or past resident of [place] -seizure disorder -an unusual or allergic reaction to infliximab, mouse proteins, other medicines, foods, dyes, or preservatives -pregnant or trying to get pregnant -breast-feeding How should I use this medicine? This medicine is for injection into a vein. It is usually given by a health care professional in a hospital or clinic setting. A special MedGuide will be given to you by the pharmacist with each prescription and refill. Be sure to read this information carefully each time. Talk to your pediatrician regarding the use of this medicine in children. Special care may be needed. Overdosage: If you think you have taken too much of this medicine contact a poison control center or emergency room at once. NOTE: This medicine is only for you. Do not share this medicine with others. What if I miss a dose? It is important not to miss your dose. Call your doctor or health care professional if you are unable to keep an appointment. What may interact with this medicine? Do not take this medicine with any of the following medications: -anakinra -rilonacept This medicine may also interact with  the following medications: -vaccines This list may not describe all possible interactions. Give your health care provider a list of all the medicines, herbs, non-prescription drugs, or dietary supplements you use. Also tell them if you smoke, drink alcohol, or use illegal drugs. Some items may interact with your medicine. What should I watch for while using this medicine? Visit your doctor or health care professional for regular checks on your progress. If you get a cold or other infection while receiving this medicine, call your doctor or health care professional. Do not treat yourself. This medicine may decrease your body's ability to fight infections. Before beginning therapy, your doctor may do a test to see if you have been exposed to tuberculosis. This medicine may make the symptoms of heart failure worse in some patients. If you notice symptoms such as increased shortness of breath or swelling of the ankles or legs, contact your health care provider right away. If you are going to have surgery or dental work, tell your health care professional or dentist that you have received this medicine. If you take this medicine for plaque psoriasis, stay out of the sun. If you cannot avoid being in the sun, wear protective clothing and use sunscreen. Do not use sun lamps or tanning beds/booths. What side effects may I notice from receiving this medicine? Side effects that you should report to your doctor or health care professional as soon as possible: -allergic reactions like  skin rash, itching or hives, swelling of the face, lips, or tongue -chest pain -fever or chills, usually related to the infusion -muscle or joint pain -red, scaly patches or raised bumps on the skin -signs of infection - fever or chills, cough, sore throat, pain or difficulty passing urine -swollen lymph nodes in the neck, underarm, or groin areas -unexplained weight loss -unusual bleeding or bruising -unusually weak or  tired -yellowing of the eyes or skin Side effects that usually do not require medical attention (report to your doctor or health care professional if they continue or are bothersome): -headache -heartburn or stomach pain -nausea, vomiting This list may not describe all possible side effects. Call your doctor for medical advice about side effects. You may report side effects to FDA at 1-800-FDA-1088. Where should I keep my medicine? This drug is given in a hospital or clinic and will not be stored at home. NOTE: This sheet is a summary. It may not cover all possible information. If you have questions about this medicine, talk to your doctor, pharmacist, or health care provider.  2012, Elsevier/Gold Standard. (09/17/2007 10:26:02 AM)

## 2011-05-21 ENCOUNTER — Encounter (HOSPITAL_COMMUNITY)
Admission: RE | Admit: 2011-05-21 | Discharge: 2011-05-21 | Disposition: A | Discharge status: Home / Self Care | Payer: Medicare Other | Source: Ambulatory Visit | Attending: Rheumatology | Admitting: Rheumatology

## 2011-05-21 ENCOUNTER — Encounter (HOSPITAL_COMMUNITY): Payer: Self-pay

## 2011-05-21 DIAGNOSIS — M069 Rheumatoid arthritis, unspecified: Secondary | ICD-10-CM | POA: Insufficient documentation

## 2011-05-21 HISTORY — DX: Unspecified urinary incontinence: R32

## 2011-05-21 MED ORDER — SODIUM CHLORIDE 0.9 % IV SOLN
3.0000 mg/kg | INTRAVENOUS | Status: DC
  Administered 2011-05-21: 300 mg via INTRAVENOUS
  Filled 2011-05-21 (×23): qty 30

## 2011-05-21 MED ORDER — SODIUM CHLORIDE 0.9 % IV SOLN
250.0000 mL | INTRAVENOUS | Status: DC
  Administered 2011-05-21: 250 mL via INTRAVENOUS

## 2011-05-21 NOTE — Discharge Instructions (Signed)
Refer to printed sheet for next appointment. Short Stay Phone # [phone]   YOUR NEXT APPOINTMENTS WILL BE  Monday 06/18/11 AT 1030 AM Tuesday 08/13/11 AT 2:00PM

## 2011-06-04 ENCOUNTER — Encounter (HOSPITAL_COMMUNITY): Payer: Medicare Other

## 2011-06-18 ENCOUNTER — Encounter (HOSPITAL_COMMUNITY)
Admission: RE | Admit: 2011-06-18 | Discharge: 2011-06-18 | Disposition: A | Discharge status: Home / Self Care | Payer: Medicare Other | Source: Ambulatory Visit | Attending: Rheumatology | Admitting: Rheumatology

## 2011-06-18 ENCOUNTER — Encounter (HOSPITAL_COMMUNITY): Payer: Self-pay

## 2011-06-18 DIAGNOSIS — M069 Rheumatoid arthritis, unspecified: Secondary | ICD-10-CM | POA: Insufficient documentation

## 2011-06-18 HISTORY — DX: Dorsalgia, unspecified: M54.9

## 2011-06-18 MED ORDER — SODIUM CHLORIDE 0.9 % IV SOLN
250.0000 mL | INTRAVENOUS | Status: DC
  Administered 2011-06-18: 250 mL via INTRAVENOUS

## 2011-06-18 MED ORDER — SODIUM CHLORIDE 0.9 % IV SOLN
3.0000 mg/kg | INTRAVENOUS | Status: DC
  Administered 2011-06-18: 300 mg via INTRAVENOUS
  Filled 2011-06-18 (×23): qty 30

## 2011-06-18 NOTE — Discharge Instructions (Signed)
Return to Short Stay August 14 2011 at 2p  Infliximab injection What is this medicine? INFLIXIMAB (in FLIX i mab) is used to treat Crohn's disease and ulcerative colitis. It is also used to treat ankylosing spondylitis, psoriasis, and some forms of arthritis. This medicine may be used for other purposes; ask your health care provider or pharmacist if you have questions. What should I tell my health care provider before I take this medicine? They need to know if you have any of these conditions: -diabetes -exposure to tuberculosis -heart failure -hepatitis or liver disease -immune system problems -infection -lung or breathing disease, like COPD -multiple sclerosis -current or past resident of [place] or [place] -seizure disorder -an unusual or allergic reaction to infliximab, mouse proteins, other medicines, foods, dyes, or preservatives -pregnant or trying to get pregnant -breast-feeding How should I use this medicine? This medicine is for injection into a vein. It is usually given by a health care professional in a hospital or clinic setting. A special MedGuide will be given to you by the pharmacist with each prescription and refill. Be sure to read this information carefully each time. Talk to your pediatrician regarding the use of this medicine in children. Special care may be needed. Overdosage: If you think you have taken too much of this medicine contact a poison control center or emergency room at once. NOTE: This medicine is only for you. Do not share this medicine with others. What if I miss a dose? It is important not to miss your dose. Call your doctor or health care professional if you are unable to keep an appointment. What may interact with this medicine? Do not take this medicine with any of the following medications: -anakinra -rilonacept This medicine may also interact with the following medications: -vaccines This list may not describe all possible  interactions. Give your health care provider a list of all the medicines, herbs, non-prescription drugs, or dietary supplements you use. Also tell them if you smoke, drink alcohol, or use illegal drugs. Some items may interact with your medicine. What should I watch for while using this medicine? Visit your doctor or health care professional for regular checks on your progress. If you get a cold or other infection while receiving this medicine, call your doctor or health care professional. Do not treat yourself. This medicine may decrease your body's ability to fight infections. Before beginning therapy, your doctor may do a test to see if you have been exposed to tuberculosis. This medicine may make the symptoms of heart failure worse in some patients. If you notice symptoms such as increased shortness of breath or swelling of the ankles or legs, contact your health care provider right away. If you are going to have surgery or dental work, tell your health care professional or dentist that you have received this medicine. If you take this medicine for plaque psoriasis, stay out of the sun. If you cannot avoid being in the sun, wear protective clothing and use sunscreen. Do not use sun lamps or tanning beds/booths. What side effects may I notice from receiving this medicine? Side effects that you should report to your doctor or health care professional as soon as possible: -allergic reactions like skin rash, itching or hives, swelling of the face, lips, or tongue -chest pain -fever or chills, usually related to the infusion -muscle or joint pain -red, scaly patches or raised bumps on the skin -signs of infection - fever or chills, cough, sore throat, pain or  difficulty passing urine -swollen lymph nodes in the neck, underarm, or groin areas -unexplained weight loss -unusual bleeding or bruising -unusually weak or tired -yellowing of the eyes or skin Side effects that usually do not require  medical attention (report to your doctor or health care professional if they continue or are bothersome): -headache -heartburn or stomach pain -nausea, vomiting This list may not describe all possible side effects. Call your doctor for medical advice about side effects. You may report side effects to FDA at 1-800-FDA-1088. Where should I keep my medicine? This drug is given in a hospital or clinic and will not be stored at home. NOTE: This sheet is a summary. It may not cover all possible information. If you have questions about this medicine, talk to your doctor, pharmacist, or health care provider.  2012, Elsevier/Gold Standard. (09/17/2007 10:26:02 AM)

## 2011-08-14 ENCOUNTER — Encounter (HOSPITAL_COMMUNITY)
Admission: RE | Admit: 2011-08-14 | Discharge: 2011-08-14 | Disposition: A | Discharge status: Home / Self Care | Payer: Medicare Other | Source: Ambulatory Visit | Attending: Rheumatology | Admitting: Rheumatology

## 2011-08-14 ENCOUNTER — Encounter (HOSPITAL_COMMUNITY): Payer: Self-pay

## 2011-08-14 DIAGNOSIS — M069 Rheumatoid arthritis, unspecified: Secondary | ICD-10-CM | POA: Insufficient documentation

## 2011-08-14 MED ORDER — SODIUM CHLORIDE 0.9 % IV SOLN: 3.0000 mg | Freq: Once | INTRAVENOUS | Status: DC

## 2011-08-14 MED ORDER — SODIUM CHLORIDE 0.9 % IV SOLN
3.0000 mg/kg | Freq: Once | INTRAVENOUS | Status: AC
  Administered 2011-08-14: 300 mg via INTRAVENOUS
  Filled 2011-08-14: qty 30

## 2011-08-14 MED ORDER — SODIUM CHLORIDE 0.9 % IV SOLN
Freq: Once | INTRAVENOUS | Status: AC
  Administered 2011-08-14: 14:00:00 via INTRAVENOUS
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
## 2011-08-14 NOTE — Progress Notes (Signed)
Marlaine Hind( Child psychotherapist) here to talk with patient

## 2011-09-18 ENCOUNTER — Other Ambulatory Visit (HOSPITAL_COMMUNITY): Payer: Self-pay | Admitting: *Deleted

## 2011-10-09 ENCOUNTER — Encounter (HOSPITAL_COMMUNITY)
Admission: RE | Admit: 2011-10-09 | Discharge: 2011-10-09 | Disposition: A | Discharge status: Home / Self Care | Payer: Medicare Other | Source: Ambulatory Visit | Attending: Rheumatology | Admitting: Rheumatology

## 2011-10-09 ENCOUNTER — Encounter (HOSPITAL_COMMUNITY): Payer: Self-pay

## 2011-10-09 DIAGNOSIS — M069 Rheumatoid arthritis, unspecified: Secondary | ICD-10-CM | POA: Insufficient documentation

## 2011-10-09 MED ORDER — SODIUM CHLORIDE 0.9 % IV SOLN
INTRAVENOUS | Status: DC
  Administered 2011-10-09: 250 mL via INTRAVENOUS

## 2011-10-09 MED ORDER — SODIUM CHLORIDE 0.9 % IV SOLN
5.0000 mg/kg | INTRAVENOUS | Status: DC
  Administered 2011-10-09: 500 mg via INTRAVENOUS
  Filled 2011-10-09: qty 50

## 2011-11-21 ENCOUNTER — Other Ambulatory Visit (HOSPITAL_COMMUNITY): Payer: Self-pay | Admitting: *Deleted

## 2011-12-04 ENCOUNTER — Encounter (HOSPITAL_COMMUNITY)
Admission: RE | Admit: 2011-12-04 | Discharge: 2011-12-04 | Disposition: A | Discharge status: Home / Self Care | Payer: Medicare Other | Source: Ambulatory Visit | Attending: Rheumatology | Admitting: Rheumatology

## 2011-12-04 ENCOUNTER — Encounter (HOSPITAL_COMMUNITY): Payer: Self-pay

## 2011-12-04 DIAGNOSIS — M069 Rheumatoid arthritis, unspecified: Secondary | ICD-10-CM | POA: Insufficient documentation

## 2011-12-04 MED ORDER — SODIUM CHLORIDE 0.9 % IV SOLN
INTRAVENOUS | Status: DC
  Administered 2011-12-04: 13:00:00 via INTRAVENOUS

## 2011-12-04 MED ORDER — SODIUM CHLORIDE 0.9 % IV SOLN
8.0000 mg/kg | INTRAVENOUS | Status: DC
  Administered 2011-12-04: 700 mg via INTRAVENOUS
  Filled 2011-12-04: qty 70

## 2012-01-29 ENCOUNTER — Encounter (HOSPITAL_COMMUNITY): Payer: Self-pay

## 2012-01-29 ENCOUNTER — Encounter (HOSPITAL_COMMUNITY)
Admission: RE | Admit: 2012-01-29 | Discharge: 2012-01-29 | Disposition: A | Discharge status: Home / Self Care | Payer: Medicare Other | Source: Ambulatory Visit | Attending: Rheumatology | Admitting: Rheumatology

## 2012-01-29 DIAGNOSIS — M069 Rheumatoid arthritis, unspecified: Secondary | ICD-10-CM | POA: Insufficient documentation

## 2012-01-29 MED ORDER — SODIUM CHLORIDE 0.9 % IV SOLN
INTRAVENOUS | Status: AC
  Administered 2012-01-29: 14:00:00 via INTRAVENOUS

## 2012-01-29 MED ORDER — SODIUM CHLORIDE 0.9 % IV SOLN
8.0000 mg/kg | INTRAVENOUS | Status: AC
  Administered 2012-01-29: 700 mg via INTRAVENOUS
  Filled 2012-01-29: qty 70

## 2012-02-27 ENCOUNTER — Other Ambulatory Visit (HOSPITAL_COMMUNITY): Payer: Self-pay | Admitting: Rheumatology

## 2012-03-07 ENCOUNTER — Other Ambulatory Visit (HOSPITAL_COMMUNITY): Payer: Self-pay | Admitting: Rheumatology

## 2012-03-11 ENCOUNTER — Encounter (HOSPITAL_COMMUNITY): Admission: RE | Admit: 2012-03-11 | Payer: Medicare Other | Source: Ambulatory Visit

## 2012-03-18 ENCOUNTER — Encounter (HOSPITAL_COMMUNITY)
Admission: RE | Admit: 2012-03-18 | Discharge: 2012-03-18 | Disposition: A | Discharge status: Home / Self Care | Payer: Medicare Other | Source: Ambulatory Visit | Attending: Rheumatology | Admitting: Rheumatology

## 2012-03-18 ENCOUNTER — Encounter (HOSPITAL_COMMUNITY): Payer: Self-pay

## 2012-03-18 DIAGNOSIS — M069 Rheumatoid arthritis, unspecified: Secondary | ICD-10-CM | POA: Insufficient documentation

## 2012-03-18 MED ORDER — SODIUM CHLORIDE 0.9 % IV SOLN
8.0000 mg/kg | INTRAVENOUS | Status: DC
  Administered 2012-03-18: 700 mg via INTRAVENOUS
  Filled 2012-03-18: qty 70

## 2012-03-18 MED ORDER — SODIUM CHLORIDE 0.9 % IV SOLN
INTRAVENOUS | Status: DC
  Administered 2012-03-18: 13:00:00 via INTRAVENOUS

## 2012-03-25 ENCOUNTER — Encounter (HOSPITAL_COMMUNITY): Payer: Medicare Other

## 2012-04-29 ENCOUNTER — Encounter (HOSPITAL_COMMUNITY)
Admission: RE | Admit: 2012-04-29 | Discharge: 2012-04-29 | Disposition: A | Discharge status: Home / Self Care | Payer: Medicare Other | Source: Ambulatory Visit | Attending: Rheumatology | Admitting: Rheumatology

## 2012-04-29 ENCOUNTER — Encounter (HOSPITAL_COMMUNITY): Payer: Self-pay

## 2012-04-29 DIAGNOSIS — M069 Rheumatoid arthritis, unspecified: Secondary | ICD-10-CM | POA: Insufficient documentation

## 2012-04-29 MED ORDER — SODIUM CHLORIDE 0.9 % IV SOLN
8.0000 mg/kg | INTRAVENOUS | Status: DC
  Administered 2012-04-29: 700 mg via INTRAVENOUS
  Filled 2012-04-29: qty 70

## 2012-04-29 MED ORDER — SODIUM CHLORIDE 0.9 % IV SOLN
INTRAVENOUS | Status: DC
  Administered 2012-04-29: 13:00:00 via INTRAVENOUS

## 2012-06-10 ENCOUNTER — Other Ambulatory Visit (HOSPITAL_COMMUNITY): Payer: Self-pay | Admitting: Rheumatology

## 2012-06-10 ENCOUNTER — Encounter (HOSPITAL_COMMUNITY): Payer: Self-pay

## 2012-06-10 ENCOUNTER — Encounter (HOSPITAL_COMMUNITY)
Admission: RE | Admit: 2012-06-10 | Discharge: 2012-06-10 | Disposition: A | Discharge status: Home / Self Care | Payer: Medicare Other | Source: Ambulatory Visit | Attending: Rheumatology | Admitting: Rheumatology

## 2012-06-10 DIAGNOSIS — M069 Rheumatoid arthritis, unspecified: Secondary | ICD-10-CM | POA: Insufficient documentation

## 2012-06-10 MED ORDER — SODIUM CHLORIDE 0.9 % IV SOLN
INTRAVENOUS | Status: DC
  Administered 2012-06-10: 250 mL via INTRAVENOUS

## 2012-06-10 MED ORDER — SODIUM CHLORIDE 0.9 % IV SOLN
10.0000 mg/kg | INTRAVENOUS | Status: DC
  Administered 2012-06-10: 900 mg via INTRAVENOUS
  Filled 2012-06-10: qty 90

## 2012-07-22 ENCOUNTER — Encounter (HOSPITAL_COMMUNITY): Payer: Self-pay

## 2012-07-22 ENCOUNTER — Encounter (HOSPITAL_COMMUNITY)
Admission: RE | Admit: 2012-07-22 | Discharge: 2012-07-22 | Disposition: A | Discharge status: Home / Self Care | Payer: Medicare Other | Source: Ambulatory Visit | Attending: Rheumatology | Admitting: Rheumatology

## 2012-07-22 DIAGNOSIS — M069 Rheumatoid arthritis, unspecified: Secondary | ICD-10-CM | POA: Insufficient documentation

## 2012-07-22 MED ORDER — INFLIXIMAB 100 MG IV SOLR
10.0000 mg/kg | INTRAVENOUS | Status: DC
  Administered 2012-07-22: 900 mg via INTRAVENOUS
  Filled 2012-07-22: qty 90

## 2012-07-22 MED ORDER — SODIUM CHLORIDE 0.9 % IV SOLN
INTRAVENOUS | Status: DC
Start: 2012-07-22 — End: 2012-07-23
  Administered 2012-07-22: 12:00:00 via INTRAVENOUS

## 2012-09-02 ENCOUNTER — Encounter (HOSPITAL_COMMUNITY): Admission: RE | Admit: 2012-09-02 | Payer: Medicare Other | Source: Ambulatory Visit

## 2012-09-02 ENCOUNTER — Other Ambulatory Visit (HOSPITAL_COMMUNITY): Payer: Self-pay | Admitting: Rheumatology

## 2012-09-09 ENCOUNTER — Encounter (HOSPITAL_COMMUNITY)
Admission: RE | Admit: 2012-09-09 | Discharge: 2012-09-09 | Disposition: A | Discharge status: Home / Self Care | Payer: Medicare Other | Source: Ambulatory Visit | Attending: Rheumatology | Admitting: Rheumatology

## 2012-09-09 ENCOUNTER — Other Ambulatory Visit: Payer: Self-pay

## 2012-09-09 ENCOUNTER — Encounter (HOSPITAL_COMMUNITY): Payer: Self-pay

## 2012-09-09 ENCOUNTER — Emergency Department (HOSPITAL_COMMUNITY)
Admission: EM | Admit: 2012-09-09 | Discharge: 2012-09-09 | Disposition: A | Discharge status: Home / Self Care | Payer: Medicare Other | Attending: Emergency Medicine | Admitting: Emergency Medicine

## 2012-09-09 ENCOUNTER — Emergency Department (HOSPITAL_COMMUNITY): Payer: Medicare Other

## 2012-09-09 DIAGNOSIS — Z79899 Other long term (current) drug therapy: Secondary | ICD-10-CM | POA: Insufficient documentation

## 2012-09-09 DIAGNOSIS — R404 Transient alteration of awareness: Secondary | ICD-10-CM | POA: Insufficient documentation

## 2012-09-09 DIAGNOSIS — Z87448 Personal history of other diseases of urinary system: Secondary | ICD-10-CM | POA: Insufficient documentation

## 2012-09-09 DIAGNOSIS — R5383 Other fatigue: Secondary | ICD-10-CM | POA: Insufficient documentation

## 2012-09-09 DIAGNOSIS — Z96659 Presence of unspecified artificial knee joint: Secondary | ICD-10-CM | POA: Insufficient documentation

## 2012-09-09 DIAGNOSIS — Z90710 Acquired absence of both cervix and uterus: Secondary | ICD-10-CM | POA: Insufficient documentation

## 2012-09-09 DIAGNOSIS — Z8719 Personal history of other diseases of the digestive system: Secondary | ICD-10-CM | POA: Insufficient documentation

## 2012-09-09 DIAGNOSIS — N189 Chronic kidney disease, unspecified: Secondary | ICD-10-CM | POA: Insufficient documentation

## 2012-09-09 DIAGNOSIS — R42 Dizziness and giddiness: Secondary | ICD-10-CM | POA: Insufficient documentation

## 2012-09-09 DIAGNOSIS — Z8739 Personal history of other diseases of the musculoskeletal system and connective tissue: Secondary | ICD-10-CM | POA: Insufficient documentation

## 2012-09-09 DIAGNOSIS — M069 Rheumatoid arthritis, unspecified: Secondary | ICD-10-CM | POA: Insufficient documentation

## 2012-09-09 DIAGNOSIS — T8089XA Other complications following infusion, transfusion and therapeutic injection, initial encounter: Secondary | ICD-10-CM | POA: Insufficient documentation

## 2012-09-09 DIAGNOSIS — Z87891 Personal history of nicotine dependence: Secondary | ICD-10-CM | POA: Insufficient documentation

## 2012-09-09 DIAGNOSIS — R5381 Other malaise: Secondary | ICD-10-CM | POA: Insufficient documentation

## 2012-09-09 DIAGNOSIS — Z9889 Other specified postprocedural states: Secondary | ICD-10-CM | POA: Insufficient documentation

## 2012-09-09 DIAGNOSIS — Y838 Other surgical procedures as the cause of abnormal reaction of the patient, or of later complication, without mention of misadventure at the time of the procedure: Secondary | ICD-10-CM | POA: Insufficient documentation

## 2012-09-09 DIAGNOSIS — M129 Arthropathy, unspecified: Secondary | ICD-10-CM | POA: Insufficient documentation

## 2012-09-09 DIAGNOSIS — J45909 Unspecified asthma, uncomplicated: Secondary | ICD-10-CM | POA: Insufficient documentation

## 2012-09-09 DIAGNOSIS — M6281 Muscle weakness (generalized): Secondary | ICD-10-CM | POA: Insufficient documentation

## 2012-09-09 DIAGNOSIS — M2550 Pain in unspecified joint: Secondary | ICD-10-CM | POA: Insufficient documentation

## 2012-09-09 LAB — URINALYSIS, ROUTINE W REFLEX MICROSCOPIC
Hgb urine dipstick: NEGATIVE
Protein, ur: NEGATIVE mg/dL
Urobilinogen, UA: 0.2 mg/dL (ref 0.0–1.0)

## 2012-09-09 LAB — BASIC METABOLIC PANEL
BUN: 8 mg/dL (ref 6–23)
Calcium: 8.8 mg/dL (ref 8.4–10.5)
Creatinine, Ser: 0.82 mg/dL (ref 0.50–1.10)
GFR calc Af Amer: 84 mL/min — ABNORMAL LOW (ref >90)
GFR calc non Af Amer: 72 mL/min — ABNORMAL LOW (ref >90)

## 2012-09-09 LAB — CBC WITH DIFFERENTIAL/PLATELET
Basophils Relative: 0 % (ref 0–1)
Eosinophils Absolute: 0.1 10*3/uL (ref 0.0–0.7)
Hemoglobin: 13.9 g/dL (ref 12.0–15.0)
MCH: 33.1 pg (ref 26.0–34.0)
MCHC: 32.9 g/dL (ref 30.0–36.0)
Monocytes Relative: 9 % (ref 3–12)
Neutrophils Relative %: 47 % (ref 43–77)
Platelets: 230 10*3/uL (ref 150–400)

## 2012-09-09 MED ORDER — SODIUM CHLORIDE 0.9 % IV SOLN
INTRAVENOUS | Status: DC
  Administered 2012-09-09 (×2): via INTRAVENOUS

## 2012-09-09 MED ORDER — SODIUM CHLORIDE 0.9 % IV SOLN
10.0000 mg/kg | INTRAVENOUS | Status: DC
  Administered 2012-09-09: 900 mg via INTRAVENOUS
  Filled 2012-09-09: qty 90

## 2012-09-09 MED ORDER — SODIUM CHLORIDE 0.9 % IV BOLUS (SEPSIS)
500.0000 mL | Freq: Once | INTRAVENOUS | Status: AC
  Administered 2012-09-09: 500 mL via INTRAVENOUS

## 2012-09-09 NOTE — Progress Notes (Signed)
Pt next scheduled appointment for REMICADE is in 6 weeks on Sept 9th. Pt states she is going out of town at that time for 2 weeks and states she would like to come for her infusion at 5 weeks. I told her I could not schedule an earlier appointment than 6 weeks without an order from her Dr. She states she has an appointment with Dr Nickola Major 09/10/12 and will discuss this with her and will have the office fax or call back orders in regard to this and we will schedule her next appointment at that time

## 2012-09-09 NOTE — ED Provider Notes (Signed)
CSN: [id number]     Arrival date & time 09/09/12  1012 History     First MD Initiated Contact with Patient 09/09/12 1041     Chief Complaint  Patient presents with  . Medication Reaction   (Consider location/radiation/quality/duration/timing/severity/associated sxs/prior Treatment) HPI Comments: 67 yo female with RA and was sent over after having unresponsive episode during remacade infusion PTA. Routine vitals found pt not responsive to normal voice or shaking, pt did not lose pulse and vitals maintained.  Pt does not recall event, she feels like she was in a deep sleep.  No hx of issues with Remicade in the past.  No ha, unilateral weakness, blood clot hx or heart issues.  Pt feels mild sleepy but at baseline.  She has had worsening joint aches recently similar to RA.    The history is provided by the patient and medical records.    Past Medical History  Diagnosis Date  . Fibromyalgia   . Arthritis 2010    Rhematoid  . Asthma   . Sleep apnea     Going for testing  . Chronic kidney disease     Has 3 kidneys  . GERD (gastroesophageal reflux disease)   . Headache(784.0)     Migraines  . Incontinence of urine   . Back ache     arthritis   Past Surgical History  Procedure Laterality Date  . Total knee arthroplasty      Bilateral , 2004 and 2009  . Lumbar disc surgery  2006  . Back surgery  2006    released nerves form being compressed  . Fracture surgery      rt foot x2  . Vaginal hysterectomy  1996    partial   No family history on file. History  Substance Use Topics  . Smoking status: Former Smoker    Types: Cigarettes    Quit date: 05/06/1980  . Smokeless tobacco: Not on file  . Alcohol Use: No   OB History   Grav Para Term Preterm Abortions TAB SAB Ect Mult Living                 Review of Systems  Constitutional: Positive for fatigue. Negative for fever and chills.  HENT: Negative for neck pain and neck stiffness.   Eyes: Negative for visual  disturbance.  Respiratory: Negative for shortness of breath.   Cardiovascular: Negative for chest pain.  Gastrointestinal: Negative for vomiting and abdominal pain.  Genitourinary: Negative for dysuria and flank pain.  Musculoskeletal: Positive for arthralgias. Negative for back pain.  Skin: Negative for rash.  Neurological: Positive for weakness (general) and light-headedness. Negative for headaches.    Allergies  Review of patient's allergies indicates no known allergies.  Home Medications   Current Outpatient Rx  Name  Route  Sig  Dispense  Refill  . predniSONE (DELTASONE) 5 MG tablet   Oral   Take 5-15 mg by mouth daily. Dosage dependant on flare ups         . acebutolol (SECTRAL) 400 MG capsule   Oral   Take 400 mg by mouth at bedtime.         . budesonide (PULMICORT) 180 MCG/ACT inhaler   Inhalation   Inhale 2 puffs into the lungs as needed.         . clonazePAM (KLONOPIN) 2 MG tablet   Oral   Take 2 mg by mouth 2 (two) times daily.         . DULoxetine (  CYMBALTA) 60 MG capsule   Oral   Take 60 mg by mouth daily.         Marland Kitchen levothyroxine (SYNTHROID, LEVOTHROID) 100 MCG tablet   Oral   Take 100 mcg by mouth daily.         . nortriptyline (PAMELOR) 50 MG capsule   Oral   Take 50 mg by mouth at bedtime.         Marland Kitchen oxybutynin (OXYTROL) 3.9 MG/24HR   Transdermal   Place 1 patch onto the skin 2 (two) times a week.          BP 117/66  Pulse 78  Temp(Src) 98.3 F (36.8 C) (Oral)  Resp 15  SpO2 92% Physical Exam  Nursing note and vitals reviewed. Constitutional: She is oriented to person, place, and time. She appears well-developed and well-nourished.  HENT:  Head: Normocephalic and atraumatic.  Eyes: Conjunctivae are normal. Right eye exhibits no discharge. Left eye exhibits no discharge.  Mild dry mm  Neck: Normal range of motion. Neck supple. No tracheal deviation present.  Cardiovascular: Normal rate and regular rhythm.    Pulmonary/Chest: Effort normal and breath sounds normal.  Abdominal: Soft. She exhibits no distension. There is no tenderness. There is no guarding.  Musculoskeletal: She exhibits tenderness (mild in joints, no signs of infection). She exhibits no edema.  Neurological: She is alert and oriented to person, place, and time. She has normal strength. No cranial nerve deficit or sensory deficit. Coordination normal. GCS eye subscore is 4. GCS verbal subscore is 5. GCS motor subscore is 6.  Mild general fatigue   Skin: Skin is warm. No rash noted.  Psychiatric: She has a normal mood and affect.    ED Course   Procedures (including critical care time)  Labs Reviewed  BASIC METABOLIC PANEL - Abnormal; Notable for the following:    GFR calc non Af Amer 72 (*)    GFR calc Af Amer 84 (*)    All other components within normal limits  CBC WITH DIFFERENTIAL - Abnormal; Notable for the following:    MCV 100.5 (*)    All other components within normal limits  URINALYSIS, ROUTINE W REFLEX MICROSCOPIC - Abnormal; Notable for the following:    APPearance CLOUDY (*)    All other components within normal limits  TROPONIN I   No results found. No diagnosis found.  MDM  Transient episode, pt convinced she was in a deep sleep. Well appearing in ED, no sxs.    Date: 09/09/2012  Rate: 63  Rhythm: normal sinus rhythm  QRS Axis: normal  Intervals: normal  ST/T Wave abnormalities: normal  Conduction Disutrbances:none  Narrative Interpretation:   Old EKG Reviewed: unchanged   With age and episode plan for cardiac eval. No signs of stroke, head CT no acute findings. Ct Head Wo Contrast  09/09/2012   *RADIOLOGY REPORT*  Clinical Data: Patient became unresponsive during a Remicade infusion.  The patient still confused after the incident.  CT HEAD WITHOUT CONTRAST  Technique:  Contiguous axial images were obtained from the base of the skull through the vertex without contrast.  Comparison: None.   Findings: Mild bifrontal cortical atrophy.  Ventricular system normal in size appearance for age.  No mass lesion.  No midline shift.  No acute hemorrhage or hematoma.  No extra-axial fluid collections.  No evidence of acute infarction.  No focal brain parenchymal abnormalities.  No skull fracture or other focal osseous abnormality involving the skull.  Visualized  paranasal sinuses, bilateral mastoid air cells, and bilateral middle ear cavities well-aerated.  Bilateral carotid siphon and left vertebral artery atherosclerosis.  IMPRESSION:  1.  No acute intracranial abnormality. 2.  Mild bifrontal cortical atrophy.   Original Report Authenticated By: Hulan Saas, M.D.   Observed in ED. Discussed observation vs home.  Pt would like to go home, will fup outpt.  Reasons to return stressed.   DC well appearing.    Enid Skeens, MD 09/09/12 [phone]

## 2012-09-09 NOTE — ED Notes (Signed)
Per short stay RN, pt here for remacade infusion, has had in past-tech found patient unresponsive

## 2012-09-09 NOTE — Progress Notes (Addendum)
Pt had been alert and oriented at 0930 when she signed paperwork. NT was asked to check routine VS at the 30 minute interval for REMICADE infusion at 0950. Pt was found to be unresponsive and BP registered at 184/169 and was repeated and found to be this again; pulse 83 and resp very shallow. REMICADE was turned off(she had only received 50 cc of 250cc infusion) and NS at kvo. O2 via mask started; RRT called. Pt placed supine in recliner. Pt would not respond to stimulation or voice command. RRNurse Braselton here.to assess patient. Brayton El PA Radiology also here. Pt would have periods of responding to squeezing hands then again unresponsive,Bp rechecked and was 119/106 andO2 sat at 93 % on face mask at 10 liter. Pt was transferred to ED at 1010 by myself and Dawson Bills and report was given to [place].

## 2012-09-09 NOTE — Progress Notes (Addendum)
Notified Morrie Sheldon (Dr. Nickola Major nurse) of patient's altered mental status. She will let Dr. Nickola Major know. See note from Lamont Snowball, RN.

## 2012-09-09 NOTE — ED Notes (Signed)
ZOX:[id number]<AV> Expected date:09/09/12<BR> Expected time:<BR> Means of arrival:<BR> Comments:<BR>

## 2012-09-15 ENCOUNTER — Other Ambulatory Visit (HOSPITAL_COMMUNITY): Payer: Self-pay | Admitting: Rheumatology

## 2012-09-17 ENCOUNTER — Encounter (HOSPITAL_COMMUNITY)
Admission: RE | Admit: 2012-09-17 | Discharge: 2012-09-17 | Disposition: A | Discharge status: Home / Self Care | Payer: Medicare Other | Source: Ambulatory Visit | Attending: Rheumatology | Admitting: Rheumatology

## 2012-09-17 ENCOUNTER — Encounter (HOSPITAL_COMMUNITY): Payer: Medicare Other

## 2012-09-17 ENCOUNTER — Encounter (HOSPITAL_COMMUNITY): Payer: Self-pay

## 2012-09-17 DIAGNOSIS — M069 Rheumatoid arthritis, unspecified: Secondary | ICD-10-CM | POA: Insufficient documentation

## 2012-09-17 MED ORDER — SODIUM CHLORIDE 0.9 % IV SOLN
10.0000 mg/kg | INTRAVENOUS | Status: DC
  Administered 2012-09-17: 900 mg via INTRAVENOUS
  Filled 2012-09-17: qty 90

## 2012-09-17 MED ORDER — SODIUM CHLORIDE 0.9 % IV SOLN
INTRAVENOUS | Status: DC
  Administered 2012-09-17: 12:00:00 via INTRAVENOUS
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
## 2012-10-14 ENCOUNTER — Encounter (HOSPITAL_COMMUNITY): Payer: Medicare Other

## 2012-10-29 ENCOUNTER — Encounter (HOSPITAL_COMMUNITY): Payer: Medicare Other

## 2012-11-06 ENCOUNTER — Encounter (HOSPITAL_COMMUNITY): Payer: Self-pay

## 2012-11-06 ENCOUNTER — Ambulatory Visit (HOSPITAL_COMMUNITY)
Admission: RE | Admit: 2012-11-06 | Discharge: 2012-11-06 | Disposition: A | Discharge status: Home / Self Care | Payer: Medicare Other | Source: Ambulatory Visit | Attending: Rheumatology | Admitting: Rheumatology

## 2012-11-06 DIAGNOSIS — M069 Rheumatoid arthritis, unspecified: Secondary | ICD-10-CM | POA: Insufficient documentation

## 2012-11-06 MED ORDER — SODIUM CHLORIDE 0.9 % IV SOLN
INTRAVENOUS | Status: DC
  Administered 2012-11-06: 13:00:00 via INTRAVENOUS

## 2012-11-06 MED ORDER — SODIUM CHLORIDE 0.9 % IV SOLN
10.0000 mg/kg | INTRAVENOUS | Status: DC
  Administered 2012-11-06: 900 mg via INTRAVENOUS
  Filled 2012-11-06: qty 90

## 2012-12-10 ENCOUNTER — Encounter (HOSPITAL_COMMUNITY): Payer: Medicare Other

## 2012-12-17 ENCOUNTER — Encounter (HOSPITAL_COMMUNITY): Payer: Medicare Other

## 2012-12-18 ENCOUNTER — Encounter (HOSPITAL_COMMUNITY)
Admission: RE | Admit: 2012-12-18 | Discharge: 2012-12-18 | Disposition: A | Discharge status: Home / Self Care | Payer: Medicare Other | Source: Ambulatory Visit | Attending: Rheumatology | Admitting: Rheumatology

## 2012-12-18 ENCOUNTER — Encounter (HOSPITAL_COMMUNITY): Payer: Self-pay

## 2012-12-18 ENCOUNTER — Other Ambulatory Visit (HOSPITAL_COMMUNITY): Payer: Self-pay | Admitting: Rheumatology

## 2012-12-18 ENCOUNTER — Inpatient Hospital Stay (HOSPITAL_COMMUNITY): Admission: RE | Admit: 2012-12-18 | Payer: Self-pay | Source: Ambulatory Visit

## 2012-12-18 DIAGNOSIS — M069 Rheumatoid arthritis, unspecified: Secondary | ICD-10-CM | POA: Insufficient documentation

## 2012-12-18 MED ORDER — SODIUM CHLORIDE 0.9 % IV SOLN
Freq: Once | INTRAVENOUS | Status: AC
  Administered 2012-12-18: 11:00:00 via INTRAVENOUS

## 2012-12-18 MED ORDER — SODIUM CHLORIDE 0.9 % IV SOLN
10.0000 mg/kg | INTRAVENOUS | Status: DC
  Administered 2012-12-18: 900 mg via INTRAVENOUS
  Filled 2012-12-18: qty 90

## 2013-01-29 ENCOUNTER — Encounter (HOSPITAL_COMMUNITY): Payer: Self-pay

## 2013-01-29 ENCOUNTER — Encounter (HOSPITAL_COMMUNITY)
Admission: RE | Admit: 2013-01-29 | Discharge: 2013-01-29 | Disposition: A | Discharge status: Home / Self Care | Payer: Medicare Other | Source: Ambulatory Visit | Attending: Rheumatology | Admitting: Rheumatology

## 2013-01-29 DIAGNOSIS — M069 Rheumatoid arthritis, unspecified: Secondary | ICD-10-CM | POA: Insufficient documentation

## 2013-01-29 MED ORDER — SODIUM CHLORIDE 0.9 % IV SOLN
10.0000 mg/kg | INTRAVENOUS | Status: DC
  Administered 2013-01-29: 900 mg via INTRAVENOUS
  Filled 2013-01-29: qty 90

## 2013-01-29 MED ORDER — SODIUM CHLORIDE 0.9 % IV SOLN
Freq: Once | INTRAVENOUS | Status: AC
  Administered 2013-01-29: 11:00:00 via INTRAVENOUS

## 2013-03-04 DIAGNOSIS — IMO0002 Reserved for concepts with insufficient information to code with codable children: Secondary | ICD-10-CM | POA: Diagnosis not present

## 2013-03-04 DIAGNOSIS — M5126 Other intervertebral disc displacement, lumbar region: Secondary | ICD-10-CM | POA: Diagnosis not present

## 2013-03-04 DIAGNOSIS — M48061 Spinal stenosis, lumbar region without neurogenic claudication: Secondary | ICD-10-CM | POA: Diagnosis not present

## 2013-03-11 ENCOUNTER — Encounter (HOSPITAL_COMMUNITY): Payer: Medicare Other

## 2013-04-02 DIAGNOSIS — E663 Overweight: Secondary | ICD-10-CM | POA: Diagnosis not present

## 2013-04-02 DIAGNOSIS — M47817 Spondylosis without myelopathy or radiculopathy, lumbosacral region: Secondary | ICD-10-CM | POA: Diagnosis not present

## 2013-04-15 DIAGNOSIS — R5383 Other fatigue: Secondary | ICD-10-CM | POA: Diagnosis not present

## 2013-04-15 DIAGNOSIS — R5381 Other malaise: Secondary | ICD-10-CM | POA: Diagnosis not present

## 2013-04-15 DIAGNOSIS — M069 Rheumatoid arthritis, unspecified: Secondary | ICD-10-CM | POA: Diagnosis not present

## 2013-04-22 DIAGNOSIS — M5126 Other intervertebral disc displacement, lumbar region: Secondary | ICD-10-CM | POA: Diagnosis not present

## 2013-04-22 DIAGNOSIS — M48061 Spinal stenosis, lumbar region without neurogenic claudication: Secondary | ICD-10-CM | POA: Diagnosis not present

## 2013-04-22 DIAGNOSIS — IMO0002 Reserved for concepts with insufficient information to code with codable children: Secondary | ICD-10-CM | POA: Diagnosis not present

## 2013-04-27 DIAGNOSIS — M2550 Pain in unspecified joint: Secondary | ICD-10-CM | POA: Diagnosis not present

## 2013-04-27 DIAGNOSIS — M069 Rheumatoid arthritis, unspecified: Secondary | ICD-10-CM | POA: Diagnosis not present

## 2013-04-27 DIAGNOSIS — M1990 Unspecified osteoarthritis, unspecified site: Secondary | ICD-10-CM | POA: Diagnosis not present

## 2013-04-27 DIAGNOSIS — IMO0001 Reserved for inherently not codable concepts without codable children: Secondary | ICD-10-CM | POA: Diagnosis not present

## 2013-05-04 ENCOUNTER — Other Ambulatory Visit: Payer: Self-pay | Admitting: Neurosurgery

## 2013-05-04 DIAGNOSIS — M47816 Spondylosis without myelopathy or radiculopathy, lumbar region: Secondary | ICD-10-CM

## 2013-05-26 DIAGNOSIS — M069 Rheumatoid arthritis, unspecified: Secondary | ICD-10-CM | POA: Diagnosis not present

## 2013-05-27 DIAGNOSIS — H269 Unspecified cataract: Secondary | ICD-10-CM | POA: Diagnosis not present

## 2013-06-10 DIAGNOSIS — M47817 Spondylosis without myelopathy or radiculopathy, lumbosacral region: Secondary | ICD-10-CM | POA: Diagnosis not present

## 2013-06-16 ENCOUNTER — Other Ambulatory Visit: Payer: Medicare Other

## 2013-06-23 DIAGNOSIS — M069 Rheumatoid arthritis, unspecified: Secondary | ICD-10-CM | POA: Diagnosis not present

## 2013-06-24 ENCOUNTER — Other Ambulatory Visit: Payer: Medicare Other

## 2013-06-30 ENCOUNTER — Ambulatory Visit
Admission: RE | Admit: 2013-06-30 | Discharge: 2013-06-30 | Disposition: A | Discharge status: Home / Self Care | Payer: Medicare Other | Source: Ambulatory Visit | Attending: Neurosurgery | Admitting: Neurosurgery

## 2013-06-30 VITALS — BP 149/61 | HR 75

## 2013-06-30 DIAGNOSIS — M47816 Spondylosis without myelopathy or radiculopathy, lumbar region: Secondary | ICD-10-CM

## 2013-06-30 DIAGNOSIS — M48061 Spinal stenosis, lumbar region without neurogenic claudication: Secondary | ICD-10-CM | POA: Diagnosis not present

## 2013-06-30 DIAGNOSIS — M545 Low back pain, unspecified: Secondary | ICD-10-CM | POA: Diagnosis not present

## 2013-06-30 DIAGNOSIS — M5124 Other intervertebral disc displacement, thoracic region: Secondary | ICD-10-CM | POA: Diagnosis not present

## 2013-06-30 MED ORDER — DIAZEPAM 5 MG PO TABS
5.0000 mg | ORAL_TABLET | Freq: Once | ORAL | Status: AC
  Administered 2013-06-30: 5 mg via ORAL

## 2013-06-30 MED ORDER — MEPERIDINE HCL 100 MG/ML IJ SOLN
75.0000 mg | Freq: Once | INTRAMUSCULAR | Status: AC
  Administered 2013-06-30: 75 mg via INTRAMUSCULAR

## 2013-06-30 MED ORDER — IOHEXOL 300 MG/ML  SOLN
10.0000 mL | Freq: Once | INTRAMUSCULAR | Status: AC | PRN
  Administered 2013-06-30: 10 mL via INTRATHECAL

## 2013-06-30 MED ORDER — ONDANSETRON HCL 4 MG/2ML IJ SOLN
4.0000 mg | Freq: Once | INTRAMUSCULAR | Status: AC
  Administered 2013-06-30: 4 mg via INTRAMUSCULAR

## 2013-06-30 NOTE — Progress Notes (Signed)
Patient states she has been off Cymbalta and Nortriptyline/Pamelor for the past two days.  jkl

## 2013-06-30 NOTE — Discharge Instructions (Signed)
Myelogram Discharge Instructions  1. Go home and rest quietly for the next 24 hours.  It is important to lie flat for the next 24 hours.  Get up only to go to the restroom.  You may lie in the bed or on a couch on your back, your stomach, your left side or your right side.  You may have one pillow under your head.  You may have pillows between your knees while you are on your side or under your knees while you are on your back.  2. DO NOT drive today.  Recline the seat as far back as it will go, while still wearing your seat belt, on the way home.  3. You may get up to go to the bathroom as needed.  You may sit up for 10 minutes to eat.  You may resume your normal diet and medications unless otherwise indicated.  Drink lots of extra fluids today and tomorrow.  4. The incidence of headache, nausea, or vomiting is about 5% (one in 20 patients).  If you develop a headache, lie flat and drink plenty of fluids until the headache goes away.  Caffeinated beverages may be helpful.  If you develop severe nausea and vomiting or a headache that does not go away with flat bed rest, call [phone].  5. You may resume normal activities after your 24 hours of bed rest is over; however, do not exert yourself strongly or do any heavy lifting tomorrow. If when you get up you have a headache when standing, go back to bed and force fluids for another 24 hours.  6. Call your physician for a follow-up appointment.  The results of your myelogram will be sent directly to your physician by the following day.  7. If you have any questions or if complications develop after you arrive home, please call [phone].  Discharge instructions have been explained to the patient.  The patient, or the person responsible for the patient, fully understands these instructions.      May resume Cymbalta and Nortriptyline on Jul 01, 2013, after 1:00 pm.

## 2013-07-08 DIAGNOSIS — IMO0002 Reserved for concepts with insufficient information to code with codable children: Secondary | ICD-10-CM | POA: Diagnosis not present

## 2013-07-08 DIAGNOSIS — M48061 Spinal stenosis, lumbar region without neurogenic claudication: Secondary | ICD-10-CM | POA: Diagnosis not present

## 2013-07-21 DIAGNOSIS — M069 Rheumatoid arthritis, unspecified: Secondary | ICD-10-CM | POA: Diagnosis not present

## 2013-08-17 DIAGNOSIS — M48061 Spinal stenosis, lumbar region without neurogenic claudication: Secondary | ICD-10-CM | POA: Diagnosis not present

## 2013-08-17 DIAGNOSIS — Z6833 Body mass index (BMI) 33.0-33.9, adult: Secondary | ICD-10-CM | POA: Diagnosis not present

## 2013-08-18 DIAGNOSIS — M069 Rheumatoid arthritis, unspecified: Secondary | ICD-10-CM | POA: Diagnosis not present

## 2013-09-09 DIAGNOSIS — M48061 Spinal stenosis, lumbar region without neurogenic claudication: Secondary | ICD-10-CM | POA: Diagnosis not present

## 2013-09-09 DIAGNOSIS — IMO0002 Reserved for concepts with insufficient information to code with codable children: Secondary | ICD-10-CM | POA: Diagnosis not present

## 2013-09-15 DIAGNOSIS — M069 Rheumatoid arthritis, unspecified: Secondary | ICD-10-CM | POA: Diagnosis not present

## 2013-09-15 DIAGNOSIS — M2550 Pain in unspecified joint: Secondary | ICD-10-CM | POA: Diagnosis not present

## 2013-09-15 DIAGNOSIS — M1990 Unspecified osteoarthritis, unspecified site: Secondary | ICD-10-CM | POA: Diagnosis not present

## 2013-09-15 DIAGNOSIS — IMO0001 Reserved for inherently not codable concepts without codable children: Secondary | ICD-10-CM | POA: Diagnosis not present

## 2013-10-12 DIAGNOSIS — M069 Rheumatoid arthritis, unspecified: Secondary | ICD-10-CM | POA: Diagnosis not present

## 2013-11-02 DIAGNOSIS — M5137 Other intervertebral disc degeneration, lumbosacral region: Secondary | ICD-10-CM | POA: Diagnosis not present

## 2013-11-02 DIAGNOSIS — I10 Essential (primary) hypertension: Secondary | ICD-10-CM | POA: Diagnosis not present

## 2013-11-02 DIAGNOSIS — Z6832 Body mass index (BMI) 32.0-32.9, adult: Secondary | ICD-10-CM | POA: Diagnosis not present

## 2013-11-03 ENCOUNTER — Other Ambulatory Visit: Payer: Self-pay | Admitting: Neurosurgery

## 2013-11-09 ENCOUNTER — Encounter (HOSPITAL_COMMUNITY): Payer: Self-pay | Admitting: Pharmacy Technician

## 2013-11-11 ENCOUNTER — Inpatient Hospital Stay (HOSPITAL_COMMUNITY): Admission: RE | Admit: 2013-11-11 | Payer: Medicare Other | Source: Ambulatory Visit

## 2013-11-12 ENCOUNTER — Encounter (HOSPITAL_COMMUNITY): Payer: Self-pay

## 2013-11-12 ENCOUNTER — Encounter (HOSPITAL_COMMUNITY)
Admission: RE | Admit: 2013-11-12 | Discharge: 2013-11-12 | Disposition: A | Discharge status: Home / Self Care | Payer: Medicare Other | Source: Ambulatory Visit | Attending: Neurosurgery | Admitting: Neurosurgery

## 2013-11-12 ENCOUNTER — Ambulatory Visit (HOSPITAL_COMMUNITY)
Admission: RE | Admit: 2013-11-12 | Discharge: 2013-11-12 | Disposition: A | Discharge status: Home / Self Care | Payer: Medicare Other | Source: Ambulatory Visit | Attending: Anesthesiology | Admitting: Anesthesiology

## 2013-11-12 DIAGNOSIS — M5136 Other intervertebral disc degeneration, lumbar region: Secondary | ICD-10-CM | POA: Insufficient documentation

## 2013-11-12 DIAGNOSIS — Z01818 Encounter for other preprocedural examination: Secondary | ICD-10-CM | POA: Diagnosis not present

## 2013-11-12 DIAGNOSIS — Z01812 Encounter for preprocedural laboratory examination: Secondary | ICD-10-CM | POA: Diagnosis not present

## 2013-11-12 DIAGNOSIS — Z01810 Encounter for preprocedural cardiovascular examination: Secondary | ICD-10-CM | POA: Insufficient documentation

## 2013-11-12 HISTORY — DX: Hypothyroidism, unspecified: E03.9

## 2013-11-12 LAB — BASIC METABOLIC PANEL
ANION GAP: 12 (ref 5–15)
BUN: 8 mg/dL (ref 6–23)
CALCIUM: 8.9 mg/dL (ref 8.4–10.5)
CO2: 25 meq/L (ref 19–32)
Chloride: 102 mEq/L (ref 96–112)
Creatinine, Ser: 0.77 mg/dL (ref 0.50–1.10)
GFR calc Af Amer: >90 mL/min (ref >90)
GFR calc non Af Amer: 84 mL/min — ABNORMAL LOW (ref >90)
Glucose, Bld: 88 mg/dL (ref 70–99)
Potassium: 4.4 mEq/L (ref 3.7–5.3)
SODIUM: 139 meq/L (ref 137–147)

## 2013-11-12 LAB — SURGICAL PCR SCREEN
MRSA, PCR: NEGATIVE
Staphylococcus aureus: POSITIVE — AB

## 2013-11-12 LAB — CBC
HEMATOCRIT: 42.9 % (ref 36.0–46.0)
Hemoglobin: 14.2 g/dL (ref 12.0–15.0)
MCH: 35 pg — ABNORMAL HIGH (ref 26.0–34.0)
MCHC: 33.1 g/dL (ref 30.0–36.0)
MCV: 105.7 fL — AB (ref 78.0–100.0)
Platelets: 231 10*3/uL (ref 150–400)
RBC: 4.06 MIL/uL (ref 3.87–5.11)
RDW: 14.6 % (ref 11.5–15.5)
WBC: 8.5 10*3/uL (ref 4.0–10.5)

## 2013-11-12 LAB — TYPE AND SCREEN
ABO/RH(D): O POS
Antibody Screen: NEGATIVE

## 2013-11-12 NOTE — Pre-Procedure Instructions (Signed)
Lisa Duke  11/12/2013   Your procedure is scheduled on:  Wednesday, October 7th  Report to [facility] Admitting at 800 AM.  Call this number if you have problems the morning of surgery: [phone]   Remember:   Do not eat food or drink liquids after midnight.   Take these medicines the morning of surgery with A SIP OF WATER: klonopin, cymbalta, synthroid, valium if needed  Stop taking aspirin, OTC vitamins/herbal medications, NSAIDS (ibuprofen, advil, motrin) 7 days prior to surgery.    Do not wear jewelry, make-up or nail polish.  Do not wear lotions, powders, or perfumes. You may wear deodorant.  Do not shave 48 hours prior to surgery. Men may shave face and neck.  Do not bring valuables to the hospital.  [facility] is not responsible for any belongings or valuables.               Contacts, dentures or bridgework may not be worn into surgery.  Leave suitcase in the car. After surgery it may be brought to your room.  For patients admitted to the hospital, discharge time is determined by your treatment team.        Please read over the following fact sheets that you were given: Pain Booklet, Coughing and Deep Breathing, Blood Transfusion Information, MRSA Information and Surgical Site Infection Prevention [place] - Preparing for Surgery  Before surgery, you can play an important role.  Because skin is not sterile, your skin needs to be as free of germs as possible.  You can reduce the number of germs on you skin by washing with CHG (chlorahexidine gluconate) soap before surgery.  CHG is an antiseptic cleaner which kills germs and bonds with the skin to continue killing germs even after washing.  Please DO NOT use if you have an allergy to CHG or antibacterial soaps.  If your skin becomes reddened/irritated stop using the CHG and inform your nurse when you arrive at Short Stay.  Do not shave (including legs and underarms) for at least 48 hours prior to the  first CHG shower.  You may shave your face.  Please follow these instructions carefully:   1.  Shower with CHG Soap the night before surgery and the morning of Surgery.  2.  If you choose to wash your hair, wash your hair first as usual with your normal shampoo.  3.  After you shampoo, rinse your hair and body thoroughly to remove the shampoo.  4.  Use CHG as you would any other liquid soap.  You can apply CHG directly to the skin and wash gently with scrungie or a clean washcloth.  5.  Apply the CHG Soap to your body ONLY FROM THE NECK DOWN.  Do not use on open wounds or open sores.  Avoid contact with your eyes, ears, mouth and genitals (private parts).  Wash genitals (private parts) with your normal soap.  6.  Wash thoroughly, paying special attention to the area where your surgery will be performed.  7.  Thoroughly rinse your body with warm water from the neck down.  8.  DO NOT shower/wash with your normal soap after using and rinsing off the CHG Soap.  9.  Pat yourself dry with a clean towel.            10.  Wear clean pajamas.            11.  Place clean sheets on your bed the night  of your first shower and do not sleep with pets.  Day of Surgery  Do not apply any lotions/deoderants the morning of surgery.  Please wear clean clothes to the hospital/surgery center.

## 2013-11-12 NOTE — Progress Notes (Signed)
rx called in to [facility] per patient request

## 2013-11-12 NOTE — Progress Notes (Signed)
Primary - dr. Lennette Bihari little or dr. Trudie Reed No cardiologist Had ekg more than a year ago at dr. Unk Pinto office

## 2013-11-13 ENCOUNTER — Encounter (HOSPITAL_COMMUNITY): Payer: Self-pay | Admitting: Emergency Medicine

## 2013-11-13 ENCOUNTER — Encounter (HOSPITAL_COMMUNITY): Payer: Self-pay | Admitting: Vascular Surgery

## 2013-11-13 NOTE — Progress Notes (Addendum)
Anesthesia Chart Review:  Pt is 68 year old female scheduled for L3-4 PLIF, augmentation of fusion/pedicle screws/posterolateral arthrodesis on 11/18/13 with Dr. Joya Salm.   PMH: dysrhythmia (unknown, has not seen cardiologist), asthma, GERD, hypothyroidism  Medications include: ASA, methotrexate, levothyroxine, nortripyline, valium, klonopin, pulmicort, acebutolol, cymbalta  Preoperative labs reviewed.    Chest x-ray reviewed. No active cardiopulmonary disease.  EKG: Sinus bradycardia Inferior infarct , age undetermined Anteroseptal infarct , age undetermined No significant change since last tracing 09/09/12  Discussed with Dr. Oletta Lamas who requests medical clearance prior to surgery.   Spoke with Janett Billow from Dr. Harley Hallmark office who will arrange for medical clearance from pt's PCP, Dr. Hulan Fess at [place] [facility].   Willeen Cass, FNP-BC [facility] Short Stay Surgical Center/Anesthesiology Phone: [phone] 11/13/2013 12:42 PM

## 2013-11-16 NOTE — Progress Notes (Signed)
Spoke to [facility] that they are not aware yet that patient needs medical clearance. Call Janett Billow and requested that they fax clearance to [place] once received from [facility].

## 2013-11-17 DIAGNOSIS — M532X9 Spinal instabilities, site unspecified: Secondary | ICD-10-CM | POA: Diagnosis not present

## 2013-11-17 DIAGNOSIS — R9431 Abnormal electrocardiogram [ECG] [EKG]: Secondary | ICD-10-CM | POA: Diagnosis not present

## 2013-11-17 DIAGNOSIS — Z01810 Encounter for preprocedural cardiovascular examination: Secondary | ICD-10-CM | POA: Diagnosis not present

## 2013-11-17 MED ORDER — CEFAZOLIN SODIUM-DEXTROSE 2-3 GM-% IV SOLR: 2.0000 g | INTRAVENOUS | Status: DC

## 2013-11-18 ENCOUNTER — Encounter (HOSPITAL_COMMUNITY): Admission: RE | Payer: Self-pay | Source: Ambulatory Visit

## 2013-11-18 ENCOUNTER — Inpatient Hospital Stay (HOSPITAL_COMMUNITY): Admission: RE | Admit: 2013-11-18 | Payer: Medicare Other | Source: Ambulatory Visit | Admitting: Neurosurgery

## 2013-11-18 SURGERY — POSTERIOR LUMBAR FUSION 1 WITH HARDWARE REMOVAL
Anesthesia: General

## 2013-11-19 DIAGNOSIS — M0579 Rheumatoid arthritis with rheumatoid factor of multiple sites without organ or systems involvement: Secondary | ICD-10-CM | POA: Diagnosis not present

## 2013-11-23 DIAGNOSIS — R9431 Abnormal electrocardiogram [ECG] [EKG]: Secondary | ICD-10-CM | POA: Diagnosis not present

## 2013-12-14 DIAGNOSIS — Z01810 Encounter for preprocedural cardiovascular examination: Secondary | ICD-10-CM | POA: Diagnosis not present

## 2013-12-14 DIAGNOSIS — E668 Other obesity: Secondary | ICD-10-CM | POA: Diagnosis not present

## 2013-12-14 DIAGNOSIS — R9431 Abnormal electrocardiogram [ECG] [EKG]: Secondary | ICD-10-CM | POA: Diagnosis not present

## 2013-12-16 ENCOUNTER — Other Ambulatory Visit: Payer: Self-pay | Admitting: Neurosurgery

## 2013-12-24 ENCOUNTER — Encounter (HOSPITAL_COMMUNITY): Payer: Self-pay

## 2013-12-24 ENCOUNTER — Encounter (HOSPITAL_COMMUNITY)
Admission: RE | Admit: 2013-12-24 | Discharge: 2013-12-24 | Disposition: A | Discharge status: Home / Self Care | Payer: Medicare Other | Source: Ambulatory Visit | Attending: Neurosurgery | Admitting: Neurosurgery

## 2013-12-24 DIAGNOSIS — Z01812 Encounter for preprocedural laboratory examination: Secondary | ICD-10-CM | POA: Diagnosis not present

## 2013-12-24 DIAGNOSIS — M79604 Pain in right leg: Secondary | ICD-10-CM | POA: Diagnosis not present

## 2013-12-24 DIAGNOSIS — M79605 Pain in left leg: Secondary | ICD-10-CM | POA: Insufficient documentation

## 2013-12-24 HISTORY — DX: Major depressive disorder, single episode, unspecified: F32.9

## 2013-12-24 HISTORY — DX: Anxiety disorder, unspecified: F41.9

## 2013-12-24 HISTORY — DX: Depression, unspecified: F32.A

## 2013-12-24 LAB — CBC
HEMATOCRIT: 44.1 % (ref 36.0–46.0)
Hemoglobin: 14.6 g/dL (ref 12.0–15.0)
MCH: 33.6 pg (ref 26.0–34.0)
MCHC: 33.1 g/dL (ref 30.0–36.0)
MCV: 101.6 fL — ABNORMAL HIGH (ref 78.0–100.0)
PLATELETS: 247 10*3/uL (ref 150–400)
RBC: 4.34 MIL/uL (ref 3.87–5.11)
RDW: 14.1 % (ref 11.5–15.5)
WBC: 7.6 10*3/uL (ref 4.0–10.5)

## 2013-12-24 LAB — BASIC METABOLIC PANEL WITH GFR
Anion gap: 12 (ref 5–15)
BUN: 7 mg/dL (ref 6–23)
CO2: 26 meq/L (ref 19–32)
Calcium: 9.4 mg/dL (ref 8.4–10.5)
Chloride: 98 meq/L (ref 96–112)
Creatinine, Ser: 0.65 mg/dL (ref 0.50–1.10)
GFR calc Af Amer: >90 mL/min
GFR calc non Af Amer: 89 mL/min — ABNORMAL LOW
Glucose, Bld: 90 mg/dL (ref 70–99)
Potassium: 4.5 meq/L (ref 3.7–5.3)
Sodium: 136 meq/L — ABNORMAL LOW (ref 137–147)

## 2013-12-24 LAB — TYPE AND SCREEN
ABO/RH(D): O POS
Antibody Screen: NEGATIVE

## 2013-12-24 LAB — SURGICAL PCR SCREEN
MRSA, PCR: NEGATIVE
STAPHYLOCOCCUS AUREUS: NEGATIVE

## 2013-12-24 NOTE — Pre-Procedure Instructions (Signed)
Lisa Duke  12/24/2013   Your procedure is scheduled on:  12-30-2013   Wednesday   Report to [facility] Admitting at 9:15  AM.   Call this number if you have problems the morning of surgery: [phone]   Remember:   Do not eat food or drink liquids after midnight.    Take these medicines the morning of surgery with A SIP OF WATER:Pulmicort inhaler as needed,clonazepam,cyclobenzaprine(Flexeril ) as needed,diazepam(valium) as needed,cymbalta,levothyroxine(Synthroid),pain medication as needed,              Stop all aspirin and aspirin products,all NSAIDS(Ibuprofen,aleve goody's powders) all herbal supplements 5 days prior to surgery     Do not wear jewelry, make-up or nail polish.  Do not wear lotions, powders, or perfumes. You may not wear deodorant.  Do not shave 48 hours prior to surgery.  Do not bring valuables to the hospital.  [facility] is not responsible  for any belongings or valuables.               Contacts, dentures or bridgework may not be worn into surgery.   Leave suitcase in the car. After surgery it may be brought to your room.  For patients admitted to the hospital, discharge time is determined by  your   treatment team.               Patients discharged the day of surgery will not be allowed to drive home.    Name and phone number of your driver:      Special Instructions: See attached sheet for instructions on CHG  Shower/bath     Please read over the following fact sheets that you were given: Pain Booklet, Coughing and Deep Breathing, Blood Transfusion Information and Surgical Site Infection Prevention

## 2013-12-24 NOTE — Progress Notes (Signed)
Pt. States that she took 2 "pain Pills"  Prior to pre-admit visit. She was confused at times and speech was difficult to understand.

## 2013-12-29 MED ORDER — CEFAZOLIN SODIUM-DEXTROSE 2-3 GM-% IV SOLR
2.0000 g | INTRAVENOUS | Status: AC
  Administered 2013-12-30: 2 g via INTRAVENOUS
  Filled 2013-12-29: qty 50

## 2013-12-29 NOTE — H&P (Signed)
Lisa Duke is an 68 y.o. female.   Chief Complaint: bilateral leg pain HPI: lumbar pain with radiation to both legs. The pain is postero-lateral and it gets worse with ambulation, she has failed with conservative treatment including epidural. Patient had cardiac clearance and wants to go ahead with surgery  Past Medical History  Diagnosis Date  . Fibromyalgia   . Arthritis 2010    Rhematoid  . GERD (gastroesophageal reflux disease)   . Headache(784.0)     Migraines  . Incontinence of urine   . Back ache     arthritis  . Dysrhythmia     "has cardiac arrhythmia" no cardiologist  . Asthma     doesnt use inhalers often  . Hypothyroidism   . Depression   . Anxiety     Past Surgical History  Procedure Laterality Date  . Total knee arthroplasty      Bilateral , 2004 and 2009  . Lumbar disc surgery  2006  . Back surgery  2006    released nerves form being compressed  . Fracture surgery      rt foot x2  . Vaginal hysterectomy  1996    partial  . Joint replacement      bilateral knee replacement    No family history on file. Social History:  reports that she quit smoking about 33 years ago. Her smoking use included Cigarettes. She smoked 0.00 packs per day. She does not have any smokeless tobacco history on file. She reports that she does not drink alcohol or use illicit drugs.  Allergies: No Known Allergies  No prescriptions prior to admission    No results found for this or any previous visit (from the past 48 hour(s)). No results found.  Review of Systems  Constitutional: Negative.   HENT: Negative.   Eyes: Negative.   Respiratory: Negative.   Cardiovascular: Negative.   Gastrointestinal: Negative.   Genitourinary: Positive for frequency.  Musculoskeletal: Positive for back pain.  Skin: Negative.   Neurological: Positive for focal weakness.  Endo/Heme/Allergies: Negative.   Psychiatric/Behavioral: The patient is nervous/anxious.     There were no  vitals taken for this visit. Physical Exam hent, nl, neck,nl. Cv, nl. Lungs,some rales. abomen soft. Extremities, nl. NEURO femoral stretch maneuver is positive bilaterally.slr positive at 60 degrees.   Assessment/Plan Lumbar myelogram shows severe stenosis at l34 with overgrowth of the facets. Solid fusion at l4-5.patient to go ahead with decompressiopn and fusion at l3-4. She is aware of risks and benefits  Lisa Duke M 12/29/2013, 5:43 PM

## 2013-12-30 ENCOUNTER — Inpatient Hospital Stay (HOSPITAL_COMMUNITY): Payer: Medicare Other

## 2013-12-30 ENCOUNTER — Inpatient Hospital Stay (HOSPITAL_COMMUNITY): Payer: Medicare Other | Admitting: Anesthesiology

## 2013-12-30 ENCOUNTER — Inpatient Hospital Stay (HOSPITAL_COMMUNITY)
Admission: RE | Admit: 2013-12-30 | Discharge: 2014-01-05 | DRG: 460 | Disposition: A | Discharge status: Home / Self Care | Payer: Medicare Other | Source: Ambulatory Visit | Attending: Neurosurgery | Admitting: Neurosurgery

## 2013-12-30 ENCOUNTER — Encounter (HOSPITAL_COMMUNITY)
Admission: RE | Disposition: A | Discharge status: Home / Self Care | Payer: Medicare Other | Source: Ambulatory Visit | Attending: Neurosurgery

## 2013-12-30 ENCOUNTER — Encounter (HOSPITAL_COMMUNITY): Payer: Self-pay | Admitting: *Deleted

## 2013-12-30 DIAGNOSIS — Z981 Arthrodesis status: Secondary | ICD-10-CM | POA: Diagnosis not present

## 2013-12-30 DIAGNOSIS — F419 Anxiety disorder, unspecified: Secondary | ICD-10-CM | POA: Diagnosis present

## 2013-12-30 DIAGNOSIS — M4806 Spinal stenosis, lumbar region: Principal | ICD-10-CM | POA: Diagnosis present

## 2013-12-30 DIAGNOSIS — Z87891 Personal history of nicotine dependence: Secondary | ICD-10-CM

## 2013-12-30 DIAGNOSIS — K219 Gastro-esophageal reflux disease without esophagitis: Secondary | ICD-10-CM | POA: Diagnosis present

## 2013-12-30 DIAGNOSIS — M79606 Pain in leg, unspecified: Secondary | ICD-10-CM | POA: Diagnosis not present

## 2013-12-30 DIAGNOSIS — F329 Major depressive disorder, single episode, unspecified: Secondary | ICD-10-CM | POA: Diagnosis present

## 2013-12-30 DIAGNOSIS — I499 Cardiac arrhythmia, unspecified: Secondary | ICD-10-CM | POA: Diagnosis present

## 2013-12-30 DIAGNOSIS — M48062 Spinal stenosis, lumbar region with neurogenic claudication: Secondary | ICD-10-CM | POA: Diagnosis present

## 2013-12-30 DIAGNOSIS — Z419 Encounter for procedure for purposes other than remedying health state, unspecified: Secondary | ICD-10-CM

## 2013-12-30 DIAGNOSIS — J45909 Unspecified asthma, uncomplicated: Secondary | ICD-10-CM | POA: Diagnosis present

## 2013-12-30 DIAGNOSIS — M797 Fibromyalgia: Secondary | ICD-10-CM | POA: Diagnosis present

## 2013-12-30 DIAGNOSIS — M129 Arthropathy, unspecified: Secondary | ICD-10-CM | POA: Diagnosis not present

## 2013-12-30 DIAGNOSIS — M069 Rheumatoid arthritis, unspecified: Secondary | ICD-10-CM | POA: Diagnosis present

## 2013-12-30 DIAGNOSIS — Z4889 Encounter for other specified surgical aftercare: Secondary | ICD-10-CM | POA: Diagnosis not present

## 2013-12-30 DIAGNOSIS — Z96653 Presence of artificial knee joint, bilateral: Secondary | ICD-10-CM | POA: Diagnosis present

## 2013-12-30 DIAGNOSIS — E039 Hypothyroidism, unspecified: Secondary | ICD-10-CM | POA: Diagnosis present

## 2013-12-30 DIAGNOSIS — M48061 Spinal stenosis, lumbar region without neurogenic claudication: Secondary | ICD-10-CM

## 2013-12-30 SURGERY — POSTERIOR LUMBAR FUSION 1 LEVEL
Anesthesia: General | Site: Back

## 2013-12-30 MED ORDER — DIPHENHYDRAMINE HCL 12.5 MG/5ML PO ELIX: 12.5000 mg | ORAL_SOLUTION | Freq: Four times a day (QID) | ORAL | Status: DC | PRN

## 2013-12-30 MED ORDER — DIAZEPAM 5 MG PO TABS
5.0000 mg | ORAL_TABLET | Freq: Four times a day (QID) | ORAL | Status: DC | PRN
  Administered 2013-12-30 – 2014-01-05 (×7): 5 mg via ORAL
  Filled 2013-12-30 (×6): qty 1

## 2013-12-30 MED ORDER — SURGIFOAM 100 EX MISC
CUTANEOUS | Status: DC | PRN
  Administered 2013-12-30: 10:00:00 via TOPICAL

## 2013-12-30 MED ORDER — MIDAZOLAM HCL 5 MG/5ML IJ SOLN
INTRAMUSCULAR | Status: DC | PRN
  Administered 2013-12-30 (×2): 1 mg via INTRAVENOUS

## 2013-12-30 MED ORDER — LIDOCAINE HCL (CARDIAC) 20 MG/ML IV SOLN
INTRAVENOUS | Status: AC
  Filled 2013-12-30: qty 5

## 2013-12-30 MED ORDER — FENTANYL 10 MCG/ML IV SOLN
INTRAVENOUS | Status: DC
  Administered 2013-12-30: 45 ug via INTRAVENOUS
  Administered 2013-12-30: 13:00:00 via INTRAVENOUS
  Administered 2013-12-30 – 2013-12-31 (×3): 30 ug via INTRAVENOUS
  Filled 2013-12-30: qty 50

## 2013-12-30 MED ORDER — MENTHOL 3 MG MT LOZG: 1.0000 | LOZENGE | OROMUCOSAL | Status: DC | PRN

## 2013-12-30 MED ORDER — OXYCODONE HCL 5 MG/5ML PO SOLN: 5.0000 mg | Freq: Once | ORAL | Status: DC | PRN

## 2013-12-30 MED ORDER — PREDNISONE 5 MG PO TABS: 5.0000 mg | ORAL_TABLET | Freq: Every day | ORAL | Status: DC | PRN

## 2013-12-30 MED ORDER — SODIUM CHLORIDE 0.9 % IJ SOLN: 3.0000 mL | INTRAMUSCULAR | Status: DC | PRN

## 2013-12-30 MED ORDER — ROCURONIUM BROMIDE 50 MG/5ML IV SOLN
INTRAVENOUS | Status: AC
  Filled 2013-12-30: qty 1

## 2013-12-30 MED ORDER — VANCOMYCIN HCL 1000 MG IV SOLR
INTRAVENOUS | Status: AC
  Filled 2013-12-30: qty 1000

## 2013-12-30 MED ORDER — ROCURONIUM BROMIDE 100 MG/10ML IV SOLN
INTRAVENOUS | Status: DC | PRN
  Administered 2013-12-30: 40 mg via INTRAVENOUS
  Administered 2013-12-30: 20 mg via INTRAVENOUS

## 2013-12-30 MED ORDER — LACTATED RINGERS IV SOLN
INTRAVENOUS | Status: DC | PRN
  Administered 2013-12-30 (×3): via INTRAVENOUS

## 2013-12-30 MED ORDER — DIPHENHYDRAMINE HCL 50 MG/ML IJ SOLN: 12.5000 mg | Freq: Four times a day (QID) | INTRAMUSCULAR | Status: DC | PRN

## 2013-12-30 MED ORDER — ONDANSETRON HCL 4 MG/2ML IJ SOLN: 4.0000 mg | Freq: Four times a day (QID) | INTRAMUSCULAR | Status: DC | PRN

## 2013-12-30 MED ORDER — BACITRACIN ZINC 500 UNIT/GM EX OINT
TOPICAL_OINTMENT | CUTANEOUS | Status: DC | PRN
  Administered 2013-12-30: 1 via TOPICAL

## 2013-12-30 MED ORDER — ZOLPIDEM TARTRATE 5 MG PO TABS
5.0000 mg | ORAL_TABLET | Freq: Every evening | ORAL | Status: DC | PRN
  Administered 2013-12-31 (×2): 5 mg via ORAL
  Filled 2013-12-30 (×2): qty 1

## 2013-12-30 MED ORDER — DEXAMETHASONE SODIUM PHOSPHATE 10 MG/ML IJ SOLN
INTRAMUSCULAR | Status: DC | PRN
  Administered 2013-12-30: 10 mg via INTRAVENOUS

## 2013-12-30 MED ORDER — SODIUM CHLORIDE 0.9 % IJ SOLN
INTRAMUSCULAR | Status: AC
  Filled 2013-12-30: qty 10

## 2013-12-30 MED ORDER — CEFAZOLIN SODIUM 1-5 GM-% IV SOLN
1.0000 g | Freq: Three times a day (TID) | INTRAVENOUS | Status: AC
  Administered 2013-12-30 (×2): 1 g via INTRAVENOUS
  Filled 2013-12-30 (×2): qty 50

## 2013-12-30 MED ORDER — THROMBIN 5000 UNITS EX SOLR
OROMUCOSAL | Status: DC | PRN
  Administered 2013-12-30: 10 mL via TOPICAL

## 2013-12-30 MED ORDER — OXYCODONE-ACETAMINOPHEN 5-325 MG PO TABS
ORAL_TABLET | ORAL | Status: AC
  Filled 2013-12-30: qty 2

## 2013-12-30 MED ORDER — PHENOL 1.4 % MT LIQD: 1.0000 | OROMUCOSAL | Status: DC | PRN

## 2013-12-30 MED ORDER — 0.9 % SODIUM CHLORIDE (POUR BTL) OPTIME
TOPICAL | Status: DC | PRN
  Administered 2013-12-30: 1000 mL

## 2013-12-30 MED ORDER — OXYCODONE HCL 5 MG PO TABS: 5.0000 mg | ORAL_TABLET | Freq: Once | ORAL | Status: DC | PRN

## 2013-12-30 MED ORDER — ONDANSETRON HCL 4 MG/2ML IJ SOLN
INTRAMUSCULAR | Status: DC | PRN
  Administered 2013-12-30: 4 mg via INTRAVENOUS

## 2013-12-30 MED ORDER — ONDANSETRON HCL 4 MG/2ML IJ SOLN: 4.0000 mg | INTRAMUSCULAR | Status: DC | PRN

## 2013-12-30 MED ORDER — NALOXONE HCL 0.4 MG/ML IJ SOLN: 0.4000 mg | INTRAMUSCULAR | Status: DC | PRN

## 2013-12-30 MED ORDER — BUPIVACAINE LIPOSOME 1.3 % IJ SUSP
20.0000 mL | Freq: Once | INTRAMUSCULAR | Status: DC
  Filled 2013-12-30: qty 20

## 2013-12-30 MED ORDER — SODIUM CHLORIDE 0.9 % IJ SOLN
3.0000 mL | Freq: Two times a day (BID) | INTRAMUSCULAR | Status: DC
  Administered 2013-12-31 – 2014-01-01 (×3): 3 mL via INTRAVENOUS

## 2013-12-30 MED ORDER — OXYCODONE-ACETAMINOPHEN 5-325 MG PO TABS
2.0000 | ORAL_TABLET | ORAL | Status: DC | PRN
  Administered 2013-12-30 – 2014-01-02 (×11): 2 via ORAL
  Administered 2014-01-02: 1 via ORAL
  Administered 2014-01-02 – 2014-01-05 (×11): 2 via ORAL
  Filled 2013-12-30 (×22): qty 2

## 2013-12-30 MED ORDER — SODIUM CHLORIDE 0.9 % IJ SOLN: 9.0000 mL | INTRAMUSCULAR | Status: DC | PRN

## 2013-12-30 MED ORDER — ASPIRIN EC 81 MG PO TBEC
81.0000 mg | DELAYED_RELEASE_TABLET | Freq: Every day | ORAL | Status: DC
  Administered 2013-12-30 – 2014-01-05 (×7): 81 mg via ORAL
  Filled 2013-12-30 (×7): qty 1

## 2013-12-30 MED ORDER — SUCCINYLCHOLINE CHLORIDE 20 MG/ML IJ SOLN
INTRAMUSCULAR | Status: AC
  Filled 2013-12-30: qty 1

## 2013-12-30 MED ORDER — ACEBUTOLOL HCL 400 MG PO CAPS
400.0000 mg | ORAL_CAPSULE | Freq: Every day | ORAL | Status: DC
  Administered 2013-12-30 – 2014-01-04 (×6): 400 mg via ORAL
  Filled 2013-12-30 (×7): qty 1

## 2013-12-30 MED ORDER — SODIUM CHLORIDE 0.9 % IV SOLN: 250.0000 mL | INTRAVENOUS | Status: DC

## 2013-12-30 MED ORDER — EPHEDRINE SULFATE 50 MG/ML IJ SOLN
INTRAMUSCULAR | Status: AC
  Filled 2013-12-30: qty 1

## 2013-12-30 MED ORDER — FENTANYL CITRATE 0.05 MG/ML IJ SOLN
INTRAMUSCULAR | Status: AC
  Filled 2013-12-30: qty 5

## 2013-12-30 MED ORDER — ACETAMINOPHEN 650 MG RE SUPP: 650.0000 mg | RECTAL | Status: DC | PRN

## 2013-12-30 MED ORDER — ONDANSETRON HCL 4 MG/2ML IJ SOLN: 4.0000 mg | Freq: Once | INTRAMUSCULAR | Status: DC | PRN

## 2013-12-30 MED ORDER — ONDANSETRON HCL 4 MG/2ML IJ SOLN
INTRAMUSCULAR | Status: AC
  Filled 2013-12-30: qty 2

## 2013-12-30 MED ORDER — BUPIVACAINE LIPOSOME 1.3 % IJ SUSP
INTRAMUSCULAR | Status: DC | PRN
  Administered 2013-12-30: 20 mL

## 2013-12-30 MED ORDER — VANCOMYCIN HCL 1000 MG IV SOLR
INTRAVENOUS | Status: DC | PRN
  Administered 2013-12-30: 2 g via TOPICAL

## 2013-12-30 MED ORDER — DULOXETINE HCL 60 MG PO CPEP
60.0000 mg | ORAL_CAPSULE | Freq: Every morning | ORAL | Status: DC
  Administered 2013-12-30 – 2014-01-05 (×7): 60 mg via ORAL
  Filled 2013-12-30 (×7): qty 1

## 2013-12-30 MED ORDER — LIDOCAINE HCL (CARDIAC) 20 MG/ML IV SOLN
INTRAVENOUS | Status: DC | PRN
  Administered 2013-12-30: 50 mg via INTRAVENOUS

## 2013-12-30 MED ORDER — DIAZEPAM 5 MG PO TABS
ORAL_TABLET | ORAL | Status: AC
Start: 2013-12-30 — End: 2013-12-31
  Administered 2013-12-31: 5 mg via ORAL
  Filled 2013-12-30: qty 1

## 2013-12-30 MED ORDER — EPHEDRINE SULFATE 50 MG/ML IJ SOLN
INTRAMUSCULAR | Status: DC | PRN
  Administered 2013-12-30 (×4): 5 mg via INTRAVENOUS

## 2013-12-30 MED ORDER — MIDAZOLAM HCL 2 MG/2ML IJ SOLN
INTRAMUSCULAR | Status: AC
  Filled 2013-12-30: qty 2

## 2013-12-30 MED ORDER — FLUTICASONE PROPIONATE HFA 44 MCG/ACT IN AERO
2.0000 | INHALATION_SPRAY | Freq: Two times a day (BID) | RESPIRATORY_TRACT | Status: DC
Start: 2013-12-30 — End: 2014-01-05
  Administered 2013-12-30 – 2014-01-05 (×9): 2 via RESPIRATORY_TRACT
  Filled 2013-12-30: qty 10.6

## 2013-12-30 MED ORDER — GLYCOPYRROLATE 0.2 MG/ML IJ SOLN
INTRAMUSCULAR | Status: DC | PRN
  Administered 2013-12-30: 0.4 mg via INTRAVENOUS

## 2013-12-30 MED ORDER — LEVOTHYROXINE SODIUM 100 MCG PO TABS
100.0000 ug | ORAL_TABLET | ORAL | Status: DC
  Administered 2013-12-31 – 2014-01-05 (×6): 100 ug via ORAL
  Filled 2013-12-30 (×6): qty 1

## 2013-12-30 MED ORDER — PROPOFOL 10 MG/ML IV BOLUS
INTRAVENOUS | Status: DC | PRN
  Administered 2013-12-30: 140 mg via INTRAVENOUS

## 2013-12-30 MED ORDER — ACETAMINOPHEN 325 MG PO TABS: 650.0000 mg | ORAL_TABLET | ORAL | Status: DC | PRN

## 2013-12-30 MED ORDER — OXYBUTYNIN 3.9 MG/24HR TD PTTW
1.0000 | MEDICATED_PATCH | TRANSDERMAL | Status: DC
Start: 2014-01-01 — End: 2014-01-05
  Administered 2014-01-01 – 2014-01-04 (×2): 1 via TRANSDERMAL
  Filled 2013-12-30 (×3): qty 1

## 2013-12-30 MED ORDER — CLONAZEPAM 1 MG PO TABS
2.0000 mg | ORAL_TABLET | Freq: Three times a day (TID) | ORAL | Status: DC
  Administered 2013-12-30 – 2014-01-05 (×17): 2 mg via ORAL
  Filled 2013-12-30 (×17): qty 2

## 2013-12-30 MED ORDER — SODIUM CHLORIDE 0.9 % IV SOLN
INTRAVENOUS | Status: DC
  Administered 2013-12-30 – 2013-12-31 (×2): via INTRAVENOUS

## 2013-12-30 MED ORDER — METHOTREXATE 2.5 MG PO TABS
12.5000 mg | ORAL_TABLET | ORAL | Status: DC
  Administered 2014-01-04: 12.5 mg via ORAL
  Filled 2013-12-30: qty 5

## 2013-12-30 MED ORDER — NORTRIPTYLINE HCL 25 MG PO CAPS
50.0000 mg | ORAL_CAPSULE | Freq: Every day | ORAL | Status: DC
  Administered 2013-12-30 – 2014-01-04 (×6): 50 mg via ORAL
  Filled 2013-12-30 (×9): qty 2

## 2013-12-30 MED ORDER — PROPOFOL 10 MG/ML IV BOLUS
INTRAVENOUS | Status: AC
  Filled 2013-12-30: qty 20

## 2013-12-30 MED ORDER — PHENYLEPHRINE 40 MCG/ML (10ML) SYRINGE FOR IV PUSH (FOR BLOOD PRESSURE SUPPORT)
PREFILLED_SYRINGE | INTRAVENOUS | Status: AC
  Filled 2013-12-30: qty 10

## 2013-12-30 MED ORDER — FENTANYL CITRATE 0.05 MG/ML IJ SOLN
INTRAMUSCULAR | Status: DC | PRN
  Administered 2013-12-30 (×4): 50 ug via INTRAVENOUS
  Administered 2013-12-30 (×2): 100 ug via INTRAVENOUS

## 2013-12-30 MED ORDER — NEOSTIGMINE METHYLSULFATE 10 MG/10ML IV SOLN
INTRAVENOUS | Status: DC | PRN
  Administered 2013-12-30: 3 mg via INTRAVENOUS

## 2013-12-30 MED ORDER — FENTANYL CITRATE 0.05 MG/ML IJ SOLN: 25.0000 ug | INTRAMUSCULAR | Status: DC | PRN

## 2013-12-30 SURGICAL SUPPLY — 78 items
APL SKNCLS STERI-STRIP NONHPOA (GAUZE/BANDAGES/DRESSINGS) ×1
BENZOIN TINCTURE PRP APPL 2/3 (GAUZE/BANDAGES/DRESSINGS) ×3 IMPLANT
BLADE CLIPPER SURG (BLADE) IMPLANT
BUR ACORN 6.0 (BURR) ×2 IMPLANT
BUR ACORN 6.0MM (BURR) ×1
BUR MATCHSTICK NEURO 3.0 LAGG (BURR) ×3 IMPLANT
CANISTER SUCT 3000ML (MISCELLANEOUS) ×3 IMPLANT
CAP LOCKING THREADED (Cap) ×4 IMPLANT
CLOSURE WOUND 1/2 X4 (GAUZE/BANDAGES/DRESSINGS) ×1
CONN LAT CLOSED HORIZ 6.35X5.5 (Connector) ×2 IMPLANT
CONNECTOR LAT CLSD HRZ6.35X5.5 (Connector) IMPLANT
CONNECTOR LATERAL 6.35X5.5 (Connector) ×6 IMPLANT
CONT SPEC 4OZ CLIKSEAL STRL BL (MISCELLANEOUS) ×5 IMPLANT
COVER BACK TABLE 60X90IN (DRAPES) ×3 IMPLANT
DRAPE C-ARM 42X72 X-RAY (DRAPES) ×6 IMPLANT
DRAPE LAPAROTOMY 100X72X124 (DRAPES) ×3 IMPLANT
DRAPE POUCH INSTRU U-SHP 10X18 (DRAPES) ×3 IMPLANT
DRSG OPSITE POSTOP 4X6 (GAUZE/BANDAGES/DRESSINGS) ×2 IMPLANT
DRSG OPSITE POSTOP 4X8 (GAUZE/BANDAGES/DRESSINGS) ×2 IMPLANT
DRSG PAD ABDOMINAL 8X10 ST (GAUZE/BANDAGES/DRESSINGS) IMPLANT
DURAPREP 26ML APPLICATOR (WOUND CARE) ×3 IMPLANT
ELECT BLADE 4.0 EZ CLEAN MEGAD (MISCELLANEOUS) ×3
ELECT REM PT RETURN 9FT ADLT (ELECTROSURGICAL) ×3
ELECTRODE BLDE 4.0 EZ CLN MEGD (MISCELLANEOUS) IMPLANT
ELECTRODE REM PT RTRN 9FT ADLT (ELECTROSURGICAL) ×1 IMPLANT
EVACUATOR 1/8 PVC DRAIN (DRAIN) ×2 IMPLANT
GAUZE SPONGE 4X4 12PLY STRL (GAUZE/BANDAGES/DRESSINGS) ×3 IMPLANT
GAUZE SPONGE 4X4 16PLY XRAY LF (GAUZE/BANDAGES/DRESSINGS) ×3 IMPLANT
GLOVE BIO SURGEON STRL SZ8 (GLOVE) ×2 IMPLANT
GLOVE BIOGEL M 8.0 STRL (GLOVE) ×5 IMPLANT
GLOVE BIOGEL PI IND STRL 7.5 (GLOVE) IMPLANT
GLOVE BIOGEL PI INDICATOR 7.5 (GLOVE) ×2
GLOVE EXAM NITRILE LRG STRL (GLOVE) IMPLANT
GLOVE EXAM NITRILE MD LF STRL (GLOVE) IMPLANT
GLOVE EXAM NITRILE XL STR (GLOVE) IMPLANT
GLOVE EXAM NITRILE XS STR PU (GLOVE) IMPLANT
GLOVE INDICATOR 8.5 STRL (GLOVE) ×2 IMPLANT
GLOVE SURG SS PI 7.0 STRL IVOR (GLOVE) ×6 IMPLANT
GOWN STRL REUS W/ TWL LRG LVL3 (GOWN DISPOSABLE) ×1 IMPLANT
GOWN STRL REUS W/ TWL XL LVL3 (GOWN DISPOSABLE) IMPLANT
GOWN STRL REUS W/TWL 2XL LVL3 (GOWN DISPOSABLE) IMPLANT
GOWN STRL REUS W/TWL LRG LVL3 (GOWN DISPOSABLE) ×3
GOWN STRL REUS W/TWL XL LVL3 (GOWN DISPOSABLE) ×6
HEMOSTAT POWDER SURGIFOAM 1G (HEMOSTASIS) ×2 IMPLANT
IMPLANT RISE 11X17-8MM SPINE (Neuro Prosthesis/Implant) ×2 IMPLANT
KIT BASIN OR (CUSTOM PROCEDURE TRAY) ×3 IMPLANT
KIT INFUSE MEDIUM (Orthopedic Implant) ×2 IMPLANT
KIT ROOM TURNOVER OR (KITS) ×3 IMPLANT
NDL HYPO 18GX1.5 BLUNT FILL (NEEDLE) IMPLANT
NDL HYPO 21X1.5 SAFETY (NEEDLE) IMPLANT
NDL HYPO 25X1 1.5 SAFETY (NEEDLE) IMPLANT
NEEDLE HYPO 18GX1.5 BLUNT FILL (NEEDLE) IMPLANT
NEEDLE HYPO 21X1.5 SAFETY (NEEDLE) ×3 IMPLANT
NEEDLE HYPO 25X1 1.5 SAFETY (NEEDLE) IMPLANT
NS IRRIG 1000ML POUR BTL (IV SOLUTION) ×3 IMPLANT
PACK FOAM VITOSS 10CC (Orthopedic Implant) ×2 IMPLANT
PACK LAMINECTOMY NEURO (CUSTOM PROCEDURE TRAY) ×3 IMPLANT
PAD ARMBOARD 7.5X6 YLW CONV (MISCELLANEOUS) ×9 IMPLANT
PATTIES SURGICAL .5 X1 (DISPOSABLE) ×3 IMPLANT
PATTIES SURGICAL .5 X3 (DISPOSABLE) IMPLANT
ROD CREO 50MM (Rod) ×2 IMPLANT
ROD CREO 60MM (Rod) ×2 IMPLANT
SCREW CREO THREADED 5.5X45MM (Screw) ×4 IMPLANT
SPONGE LAP 4X18 X RAY DECT (DISPOSABLE) IMPLANT
SPONGE NEURO XRAY DETECT 1X3 (DISPOSABLE) IMPLANT
SPONGE SURGIFOAM ABS GEL 100 (HEMOSTASIS) ×3 IMPLANT
STRIP CLOSURE SKIN 1/2X4 (GAUZE/BANDAGES/DRESSINGS) ×2 IMPLANT
SUT VIC AB 1 CT1 18XBRD ANBCTR (SUTURE) ×2 IMPLANT
SUT VIC AB 1 CT1 8-18 (SUTURE) ×6
SUT VIC AB 2-0 CP2 18 (SUTURE) ×3 IMPLANT
SUT VIC AB 3-0 SH 8-18 (SUTURE) ×3 IMPLANT
SYR 20CC LL (SYRINGE) ×2 IMPLANT
SYR 20ML ECCENTRIC (SYRINGE) ×3 IMPLANT
SYR 5ML LL (SYRINGE) IMPLANT
TOWEL OR 17X24 6PK STRL BLUE (TOWEL DISPOSABLE) ×3 IMPLANT
TOWEL OR 17X26 10 PK STRL BLUE (TOWEL DISPOSABLE) ×3 IMPLANT
TRAY FOLEY CATH 14FRSI W/METER (CATHETERS) ×3 IMPLANT
WATER STERILE IRR 1000ML POUR (IV SOLUTION) ×3 IMPLANT

## 2013-12-30 NOTE — Anesthesia Postprocedure Evaluation (Signed)
  Anesthesia Post-op Note  Patient: Lisa Duke  Procedure(s) Performed: Procedure(s) with comments: Lumbar three-four Posterior lumbar interbody fusion (N/A) - Lumbar three-four Posterior lumbar interbody fusion  Patient Location: PACU  Anesthesia Type:General  Level of Consciousness: awake, alert  and oriented  Airway and Oxygen Therapy: Patient Spontanous Breathing and Patient connected to nasal cannula oxygen  Post-op Pain: mild  Post-op Assessment: Post-op Vital signs reviewed, Patient's Cardiovascular Status Stable, Respiratory Function Stable, Patent Airway and Pain level controlled  Post-op Vital Signs: stable  Last Vitals:  Filed Vitals:   12/30/13 1415  BP:   Pulse: 63  Temp: 36.6 C  Resp: 18    Complications: No apparent anesthesia complications

## 2013-12-30 NOTE — Progress Notes (Signed)
OT Cancellation Note  Patient Details Name: Lisa Duke MRN: [id number] DOB: 08-23-1945   Cancelled Treatment:    Reason Eval/Treat Not Completed: Other (comment) (pt thinks she is suppose to have brace and it is not here.) Discussed with nurse.  Benito Mccreedy OTR/L [phone]  12/30/2013, 5:38 PM

## 2013-12-30 NOTE — Transfer of Care (Signed)
Immediate Anesthesia Transfer of Care Note  Patient: Lisa Duke  Procedure(s) Performed: Procedure(s) with comments: Lumbar three-four Posterior lumbar interbody fusion (N/A) - Lumbar three-four Posterior lumbar interbody fusion  Patient Location: PACU  Anesthesia Type:General  Level of Consciousness: awake, alert , oriented and patient cooperative  Airway & Oxygen Therapy: Patient Spontanous Breathing and Patient connected to nasal cannula oxygen  Post-op Assessment: Report given to PACU RN, Post -op Vital signs reviewed and stable and Patient moving all extremities  Post vital signs: Reviewed and stable  Complications: No apparent anesthesia complications

## 2013-12-30 NOTE — Anesthesia Preprocedure Evaluation (Signed)
Anesthesia Evaluation  Patient identified by MRN, date of birth, ID band Patient awake    Reviewed: Allergy & Precautions, H&P , NPO status , Patient's Chart, lab work & pertinent test results  Airway Mallampati: II  TM Distance: >3 FB   Mouth opening: Limited Mouth Opening  Dental  (+) Teeth Intact, Dental Advisory Given   Pulmonary former smoker,  breath sounds clear to auscultation        Cardiovascular Rhythm:Regular Rate:Normal     Neuro/Psych    GI/Hepatic   Endo/Other    Renal/GU      Musculoskeletal   Abdominal   Peds  Hematology   Anesthesia Other Findings   Reproductive/Obstetrics                             Anesthesia Physical Anesthesia Plan  ASA: II  Anesthesia Plan: General   Post-op Pain Management:    Induction: Intravenous  Airway Management Planned: Oral ETT  Additional Equipment:   Intra-op Plan:   Post-operative Plan:   Informed Consent: I have reviewed the patients History and Physical, chart, labs and discussed the procedure including the risks, benefits and alternatives for the proposed anesthesia with the patient or authorized representative who has indicated his/her understanding and acceptance.   Dental advisory given  Plan Discussed with: CRNA and Anesthesiologist  Anesthesia Plan Comments:         Anesthesia Quick Evaluation

## 2013-12-30 NOTE — Op Note (Signed)
Lisa Duke, Lisa Duke NO.:  [id number]  MEDICAL RECORD NO.:  [id number]  LOCATION:  [id number]                        FACILITY:  [facility]  PHYSICIAN:  Leeroy Cha, M.D.   DATE OF BIRTH:  05-26-1945  DATE OF PROCEDURE:  12/30/2013 DATE OF DISCHARGE:                              OPERATIVE REPORT   PREOPERATIVE DIAGNOSES:  L3-L4 stenosis with neurogenic claudication. Facet arthropathy.  Status post fusion, L4-5.  POSTOPERATIVE DIAGNOSES:  L3-L4 stenosis with neurogenic claudication. Facet arthropathy.  Status post fusion, L4-5.  PROCEDURES:  L3 laminectomy.  Facetectomy.  Lysis of adhesions. Bilateral diskectomy more than normal to be able to introduce a cage, expandable.  Augmentation of the screw with insertion of hook in the rod of L4 and new set of pedicles at the level of L3.  Posterolateral arthrodesis without BMP, autograft and Vitoss.  Cell Saver.  C-arm.  SURGEON:  Leeroy Cha, M.D.  ASSISTANT:  Kary Kos, M.D.  CLINICAL HISTORY:  Ms. Welliver is a lady who in the past underwent fusion at the level oft L4-5.  The patient is doing really well, but later, she had been complaining of back pain radiation to both legs. Myelogram showed that she has severe stenosis at the level of L3-4 with facet arthropathy.  The area where she had surgery at the of L4-5 and looks normal.  Surgery was advised.  The patient had cardiac clearance.  DESCRIPTION OF PROCEDURE:  The patient was taken to the OR, and after intubation, she was positioned in a prone manner.  The back was cleaned with Betadine and later on with DuraPrep.  Drapes were applied.  Midline incision was made using the previous incision through the skin, subcutaneous tissue, through a thick adipose tissue straight down to the cervical area.  We retracted the muscle laterally and we were able to see the pedicle of L4 as well as the rod connecting the L4-L5.  The area was healed scar tissue.  Then, with the  [place], we removed the spinous process of L3 and the lamina.  With the drill, we were able to proceed with removal of the facet bilaterally.  We entered the disk space first in the right side and later in the left side.  Total gross diskectomy medial and lateral was made.  Diskectomy was more than normal to be able to introduce an expandable cage with BMP and autograft.  X-ray showed good position of the cage.  It was close to the midline.  Using the C- arm first in AP view and then in lateral view, we made a hole in the pedicle of L3.  Prior to introducing the screw, we feel the all four quadrants just to be sure that we were surrounded by bone.  Two screws of 5.5 x 45 were introduced.  Then, a hook was introduced at the level of rod of L4-L5 was secured in place.  Then, the hook which has a hole was connected to the pedicle of L3.  AP and lateral showed good position of the new hardware.  From then on, the area was irrigated.  We went laterally and we removed the periosteum and the eschar tissue at the level of  L3-L4 and a mix of BMP, autograft and Vitoss were used for arthrodesis.  The area was irrigated.  Valsalva maneuver was negative. Prior to close, we filled up the L3-4 disk space laterally with mix of BMP, autograft and Vitoss. From then on, the area was irrigated, drain was left in the epidural space.  The wound was closed with different layer of Vicryl and the skin with staple.  The patient did well.          ______________________________ Leeroy Cha, M.D.     EB/MEDQ  D:  12/30/2013  T:  12/30/2013  Job:  [id number]

## 2013-12-30 NOTE — Progress Notes (Signed)
Pt is admitted to room 4N26 from PACU

## 2013-12-30 NOTE — Progress Notes (Signed)
Utilization review completed.  

## 2013-12-31 MED ORDER — INFLUENZA VAC SPLIT QUAD 0.5 ML IM SUSY
0.5000 mL | PREFILLED_SYRINGE | INTRAMUSCULAR | Status: AC
  Administered 2014-01-01: 0.5 mL via INTRAMUSCULAR
  Filled 2013-12-31: qty 0.5

## 2013-12-31 MED FILL — Heparin Sodium (Porcine) Inj 1000 Unit/ML: INTRAMUSCULAR | Qty: 30 | Status: AC

## 2013-12-31 MED FILL — Sodium Chloride IV Soln 0.9%: INTRAVENOUS | Qty: 1000 | Status: AC

## 2013-12-31 NOTE — Progress Notes (Signed)
OOB WITH NURSING IN HALL PER WALKER ~200 FT. NO ACUTE DISTRESS. MOD ASSIST.

## 2013-12-31 NOTE — Progress Notes (Signed)
Patient ID: Lisa Duke, female   DOB: [birth year], 68 y.o.   MRN: [id number] Ambulating, no weakness. Drain removed

## 2013-12-31 NOTE — Progress Notes (Signed)
Orthopedic Tech Progress Note Patient Details:  Lisa Duke 07-01-1945 [id number]  Patient ID: [name], female   DOB: [birth year], 67 y.o.   MRN: [id number] [employer] brace order completed  Hildred Priest 12/31/2013, 10:07 AM

## 2013-12-31 NOTE — Evaluation (Signed)
Physical Therapy Evaluation Patient Details Name: Lisa Duke MRN: [id number] DOB: [birth year] Today's Date: 12/31/2013   History of Present Illness  68 yo female s/p L3-4 laminectomy, facetectomy posterolateral arthrodesis. PMH: Fibromyalgia, Arthritis, depression, anxiety, BIL TKA, lumbar surgery, Rt foot surg  Clinical Impression  Patient is s/p above surgery resulting in the deficits listed below (see PT Problem List).  Patient will benefit from skilled PT to increase their independence and safety with mobility (while adhering to their precautions) to allow discharge       Follow Up Recommendations Home health PT;Supervision/Assistance - 24 hour    Equipment Recommendations  None recommended by PT    Recommendations for Other Services OT consult     Precautions / Restrictions Precautions Precautions: Back Precaution Booklet Issued: Yes (comment) Required Braces or Orthoses: Spinal Brace Spinal Brace: Lumbar corset;Applied in sitting position      Mobility  Bed Mobility Overal bed mobility: Needs Assistance Bed Mobility: Sit to Sidelying;Rolling Rolling: Supervision       Sit to sidelying: Min assist General bed mobility comments: pt up in bathroom with nursing on arrival; despite cues for sit to side for return to bed, pt began twisting to lie back to supine from sit; pt required step by step instructions to adhere to back precautions and assist with raising legs onto bed  Transfers Overall transfer level: Needs assistance Equipment used: Rolling walker (2 wheeled) Transfers: Sit to/from Stand Sit to Stand: Min guard         General transfer comment: vc for proper hand placement coming off BSC and sitting down onto bed  Ambulation/Gait Ambulation/Gait assistance: Min guard Ambulation Distance (Feet): 15 Feet Assistive device: Rolling walker (2 wheeled) Gait Pattern/deviations: Step-through pattern;Decreased stride length Gait velocity: decr due to  pain   General Gait Details: Pt reported she walked twice in halls this morning and insisted on returning to bed despite encouragement to sit up for lunch  Stairs            Wheelchair Mobility    Modified Rankin (Stroke Patients Only)       Balance                                             Pertinent Vitals/Pain Pain Assessment: 0-10 Pain Score: 4  Pain Location: low back Pain Intervention(s): Limited activity within patient's tolerance;Monitored during session;Repositioned    Home Living Family/patient expects to be discharged to:: Private residence Living Arrangements: Spouse/significant other Available Help at Discharge: Family;Available 24 hours/day;Friend(s) Type of Home: House Home Access: Stairs to enter Entrance Stairs-Rails: Right;Left;Can reach both Entrance Stairs-Number of Steps: 4 Home Layout: Two level;1/2 bath on main level;Bed/bath upstairs (moved twin bed downstairs) Home Equipment: Clinical cytogeneticist - 2 wheels;Toilet riser;Adaptive equipment (walk in shower)      Prior Function Level of Independence: Needs assistance   Gait / Transfers Assistance Needed: holding onto husband to walk; borrowed mother's w/c for community locomotion           Hand Dominance        Extremity/Trunk Assessment   Upper Extremity Assessment: Defer to OT evaluation           Lower Extremity Assessment: Overall WFL for tasks assessed      Cervical / Trunk Assessment: Normal  Communication   Communication: No difficulties  Cognition Arousal/Alertness: Lethargic;Suspect due to medications (groggy,  requesting information be repeated) Behavior During Therapy: Flat affect Overall Cognitive Status: No family/caregiver present to determine baseline cognitive functioning                      General Comments      Exercises        Assessment/Plan    PT Assessment Patient needs continued PT services  PT Diagnosis  Difficulty walking;Acute pain   PT Problem List Decreased activity tolerance;Decreased mobility;Decreased knowledge of use of DME;Decreased knowledge of precautions;Pain  PT Treatment Interventions DME instruction;Gait training;Stair training;Functional mobility training;Therapeutic activities;Patient/family education   PT Goals (Current goals can be found in the Care Plan section) Acute Rehab PT Goals Patient Stated Goal: to stay downstairs until ready to do flight of stairs PT Goal Formulation: With patient Time For Goal Achievement: 01/05/14 Potential to Achieve Goals: Good    Frequency Min 5X/week   Barriers to discharge        Co-evaluation               End of Session Equipment Utilized During Treatment: Gait belt Activity Tolerance: Patient tolerated treatment well Patient left: in bed;with call bell/phone within reach;with bed alarm set Nurse Communication: Mobility status         Time: 0109-3235 PT Time Calculation (min) (ACUTE ONLY): 26 min   Charges:   PT Evaluation $Initial PT Evaluation Tier I: 1 Procedure PT Treatments $Therapeutic Activity: 8-22 mins   PT G Codes:          Phinneas Shakoor January 26, 2014, 11:47 AM Pager [phone]

## 2013-12-31 NOTE — Progress Notes (Signed)
Occupational Therapy Evaluation Patient Details Name: Lisa Duke MRN: [id number] DOB: 1946/02/12 Today's Date: 12/31/2013    History of Present Illness 68 yo female s/p L3-4 laminectomy, facetectomy posterolateral arthrodesis. PMH: Fibromyalgia, Arthritis, depression, anxiety, BIL TKA, lumbar surgery, Rt foot surg   Clinical Impression   Pt is s/p above surgery resulting in functional limitations due to the deficits listed below (see OT problem list). Pt with LOB x3 during toilet transfer/clothing manipulation and required min (A) to regain balance. Pt will need reinforcement of back precautions during functional activities. Pt will benefit from skilled OT acutely to increase independence and safety with ADLS to allow discharge home.       Follow Up Recommendations  No OT follow up;Supervision/Assistance - 24 hour    Equipment Recommendations  None recommended by OT    Recommendations for Other Services       Precautions / Restrictions Precautions Precautions: Back Precaution Comments: Able to verbalize 2/3 precautions (forgot arching) Required Braces or Orthoses: Spinal Brace Spinal Brace: Lumbar corset;Applied in sitting position Restrictions Weight Bearing Restrictions: No      Mobility Bed Mobility Overal bed mobility: Needs Assistance Bed Mobility: Rolling;Sidelying to Sit;Sit to Sidelying Rolling: Supervision Sidelying to sit: Min assist     Sit to sidelying: Min guard General bed mobility comments: HOB flat; use of bedrails. Verbal cues for proper log roll technique.  Transfers Overall transfer level: Needs assistance Equipment used: Rolling walker (2 wheeled) Transfers: Sit to/from Stand Sit to Stand: Min assist         General transfer comment: Min (A) to stabilize RW as pt attempting to pull up on it to stand. Min (A) for slight boost to stand and to maintain balance.     Balance Overall balance assessment: Needs assistance Sitting-balance  support: Single extremity supported;No upper extremity supported Sitting balance-Leahy Scale: Fair     Standing balance support: Bilateral upper extremity supported;During functional activity Standing balance-Leahy Scale: Poor Standing balance comment: LOB x3 with no UE support during standing static activity.                  Standardized Balance Assessment Standardized Balance Assessment : Dynamic Gait Index   Dynamic Gait Index Gait with Horizontal Head Turns: Mild Impairment Gait with Vertical Head Turns: Mild Impairment      ADL Overall ADL's : Needs assistance/impaired     Grooming: Wash/dry hands;Min guard;Standing Grooming Details (indicate cue type and reason): Min guard (A) for safety/balance         Upper Body Dressing : Supervision/safety;Sitting Upper Body Dressing Details (indicate cue type and reason): Pt able to don/doff brace seated EOB with set up provided     Toilet Transfer: Minimal assistance;Cueing for safety;Ambulation;Regular Toilet;RW Toilet Transfer Details (indicate cue type and reason): Pt with LOB x3. Min (A) to maintain balance and to descend to toilet safely. Verbal cues for safe hand placement. Toileting- Clothing Manipulation and Hygiene: Minimal assistance;Cueing for safety;Cueing for back precautions;Sit to/from stand Toileting - Clothing Manipulation Details (indicate cue type and reason): Pt with LOB x3. Min (A) to maintain balance.      Functional mobility during ADLs: Min guard;Rolling walker General ADL Comments: Pt groggy during session and responding inappropriately to some questions. Pt having trouble following one-step commands suspect to grogginess from pain medication. Pt with LOB x3 during toilet transfer/clothing manipulation and required min (A) and grab bars to regain balance. Pt requesting to walk in hallway and ambulated 250'. Pt able to maintain  conversation and complete head turns with no overt LOB. Pt veering toward R  side of hallway during each head turn.     Vision                     Perception     Praxis      Pertinent Vitals/Pain Pain Assessment: 0-10 Pain Score: 5  Pain Location: low back Pain Descriptors / Indicators: Sore Pain Intervention(s): Limited activity within patient's tolerance;Monitored during session;Repositioned     Hand Dominance Right   Extremity/Trunk Assessment Upper Extremity Assessment Upper Extremity Assessment: Overall WFL for tasks assessed   Lower Extremity Assessment Lower Extremity Assessment: Defer to PT evaluation   Cervical / Trunk Assessment Cervical / Trunk Assessment: Normal   Communication Communication Communication: No difficulties   Cognition Arousal/Alertness: Lethargic;Suspect due to medications Behavior During Therapy: [facility] for tasks assessed/performed Overall Cognitive Status: No family/caregiver present to determine baseline cognitive functioning       Memory: Decreased recall of precautions             General Comments       Exercises       Shoulder Instructions      Home Living Family/patient expects to be discharged to:: Private residence                   Bathroom Shower/Tub: Walk-in shower;Door   [employer] Toilet: Standard     Home Equipment: Grab bars - tub/shower          Prior Functioning/Environment               OT Diagnosis: Generalized weakness;Cognitive deficits;Acute pain   OT Problem List: Decreased strength;Decreased activity tolerance;Impaired balance (sitting and/or standing);Decreased coordination;Decreased cognition;Decreased safety awareness;Decreased knowledge of use of DME or AE;Decreased knowledge of precautions;Obesity;Pain   OT Treatment/Interventions: Self-care/ADL training;Therapeutic exercise;DME and/or AE instruction;Therapeutic activities;Patient/family education;Balance training    OT Goals(Current goals can be found in the care plan section) Acute Rehab OT  Goals Patient Stated Goal: to go home to golden retriever OT Goal Formulation: With patient Time For Goal Achievement: 01/14/14 Potential to Achieve Goals: Good ADL Goals Pt Will Perform Lower Body Bathing: with supervision;with adaptive equipment;sit to/from stand Pt Will Transfer to Toilet: with supervision;ambulating;regular height toilet Pt Will Perform Toileting - Clothing Manipulation and hygiene: with supervision;sit to/from stand Pt Will Perform Tub/Shower Transfer: Shower transfer;with min guard assist;ambulating;shower seat;grab bars;rolling walker Additional ADL Goal #1: Pt will verbalize 3/3 back precautions with 100% accuracy prior to discharge. Additional ADL Goal #2: Pt will don/doff brace independently seated EOB.  OT Frequency: Min 2X/week   Barriers to D/C:            Co-evaluation              End of Session Equipment Utilized During Treatment: Gait belt;Rolling walker;Back brace Nurse Communication: Mobility status;Precautions  Activity Tolerance: Patient tolerated treatment well Patient left: in bed;with call bell/phone within reach;with bed alarm set   Time: 1435-1459 OT Time Calculation (min): 24 min Charges:    G-Codes:    Redmond Baseman 01-01-14, 3:54 PM

## 2013-12-31 NOTE — Progress Notes (Signed)
Foley removed. Pt educated on back precautions. Pt ambulated in halls.

## 2013-12-31 NOTE — Plan of Care (Signed)
Problem: Phase I Progression Outcomes Goal: Pain controlled with appropriate interventions Outcome: Completed/Met Date Met:  12/31/13 Goal: OOB as tolerated unless otherwise ordered Outcome: Completed/Met Date Met:  12/31/13 Goal: Log roll for position change Outcome: Completed/Met Date Met:  12/31/13 Goal: Initial discharge plan identified Outcome: Completed/Met Date Met:  12/31/13 Goal: PT/OT consults requested Outcome: Completed/Met Date Met:  12/31/13 Goal: Hemodynamically stable Outcome: Completed/Met Date Met:  12/31/13  Problem: Phase II Progression Outcomes Goal: Progress activity as tolerated unless otherwise ordered Outcome: Completed/Met Date Met:  12/31/13 Goal: Discharge plan established Outcome: Completed/Met Date Met:  12/31/13 Goal: Tolerating diet Outcome: Completed/Met Date Met:  12/31/13 Goal: Verbalizes of donning/doffing brace Outcome: Completed/Met Date Met:  12/31/13 Goal: Understands assist devices with ambulation Outcome: Completed/Met Date Met:  12/31/13 Goal: PT/OT consults completed Outcome: Completed/Met Date Met:  12/31/13  Problem: Phase III Progression Outcomes Goal: Pain controlled on oral analgesia Outcome: Completed/Met Date Met:  12/31/13 Goal: Activity at appropriate level-compared to baseline (UP IN CHAIR FOR HEMODIALYSIS) Outcome: Completed/Met Date Met:  12/31/13 Goal: Demonstrates donning/doffing brace Outcome: Completed/Met Date Met:  12/31/13 Goal: Demonstrates proper use of assistive devices Outcome: Completed/Met Date Met:  12/31/13

## 2013-12-31 NOTE — Clinical Social Work Note (Signed)
Clinical Social Worker received referral for possible ST-SNF placement.  Chart reviewed.  Per RN note, patient up ambulating in the halls.  Spoke with RN Case Manager who will follow up with patient to discuss home health needs if needed prior to discharge.    CSW signing off - please re consult if social work needs arise.  Lisa Duke, Lisa Duke

## 2014-01-01 DIAGNOSIS — M4806 Spinal stenosis, lumbar region: Secondary | ICD-10-CM | POA: Diagnosis not present

## 2014-01-01 MED ORDER — BISACODYL 5 MG PO TBEC
5.0000 mg | DELAYED_RELEASE_TABLET | Freq: Every day | ORAL | Status: DC | PRN
  Administered 2014-01-03: 5 mg via ORAL
  Filled 2014-01-01: qty 1

## 2014-01-01 NOTE — Progress Notes (Signed)
Occupational Therapy Treatment Patient Details Name: Lisa Duke MRN: [id number] DOB: Jun 22, 1945 Today's Date: 01/01/2014    History of present illness 68 yo female s/p L3-4 laminectomy, facetectomy posterolateral arthrodesis. PMH: Fibromyalgia, Arthritis, depression, anxiety, BIL TKA, lumbar surgery, Rt foot surg   OT comments  Pt more lethargic during today's session suspect to pain medication. Pt's brother claims she is not used to pain medication she is currently on and would be less "loopy" on Oxycontin which she normally takes. Pt educated and practiced with AE for LB adls and provided an AE handout. Pt with increase instability during ambulation and demonstrating posterior lean impacting ability to remain safe at home. Pt also unable to verbalize any back precautions during session and required visual cues to name 1/3. Pt is not safe to d/c home. D/c plan is to go home, but therapy has not educated spouse and question ability to remain safe at home with current level of arousal. OT will follow acutely to address goals.   Follow Up Recommendations  Home health OT;Supervision/Assistance - 24 hour    Equipment Recommendations  Other (comment) (adaptive equipment)    Recommendations for Other Services      Precautions / Restrictions Precautions Precautions: Back Precaution Comments: Reviewed precautions. Pt able to verbalize 1/3 with visual cues. Required Braces or Orthoses: Spinal Brace Spinal Brace: Lumbar corset;Applied in sitting position Restrictions Weight Bearing Restrictions: No       Mobility Bed Mobility Overal bed mobility: Needs Assistance Bed Mobility: Rolling;Sidelying to Sit Rolling: Supervision Sidelying to sit: Supervision       General bed mobility comments: HOB flat; no use of bedrails to simulate home. Verbal cues to avoid twisting  Transfers Overall transfer level: Needs assistance Equipment used: Rolling walker (2 wheeled) Transfers: Sit  to/from Stand Sit to Stand: Min guard         General transfer comment: Min guard (A) for safety/balance. Pt demonstrating posterior lean upon standing.    Balance Overall balance assessment: Needs assistance Sitting-balance support: No upper extremity supported;Feet supported Sitting balance-Leahy Scale: Fair   Postural control: Posterior lean Standing balance support: Bilateral upper extremity supported;During functional activity Standing balance-Leahy Scale: Poor Standing balance comment: Posterior leaning and sway during static standing activities at sink                   ADL Overall ADL's : Needs assistance/impaired     Grooming: Wash/dry hands;Min guard;Standing Grooming Details (indicate cue type and reason): Min guard (A) for safety/balance. Pt demonstrating sway and posterior lean at sink         Upper Body Dressing : Supervision/safety;Sitting Upper Body Dressing Details (indicate cue type and reason): Pt able to don/doff brace seated EOB  Lower Body Dressing: Supervision/safety;With adaptive equipment;Cueing for sequencing;Adhering to back precautions;Sitting/lateral leans   Toilet Transfer: Min guard;Ambulation;BSC;RW Toilet Transfer Details (indicate cue type and reason): Min guard (A) for safety/balance. Pt demonstrating posterior lean. Toileting- Water quality scientist and Hygiene: Min guard;Adhering to back precautions;Sit to/from stand       Functional mobility during ADLs: Min guard;Rolling walker General ADL Comments: Pt more groggy/loopy today suspect to pain meds. Pt's brother states this is different than her baseline and inquired about which pain meds she was on. Pt educated and practiced with AE for LB adls. Brother states he has a long-handled shoe horn for her to borrow. Pt was unsteady during ambulation in hallway 150' with and incr in posterior lean than in yesterday's session and could not  tolerate balance challenges. Pt still veering towards  R side of hallway.      Vision                     Perception     Praxis      Cognition   Behavior During Therapy: [facility] for tasks assessed/performed Overall Cognitive Status: Impaired/Different from baseline Area of Impairment: Attention;Memory;Following commands;Safety/judgement;Awareness;Problem solving   Current Attention Level: Selective Memory: Decreased recall of precautions  Following Commands: Follows one step commands with increased time Safety/Judgement: Decreased awareness of safety;Decreased awareness of deficits Awareness: Emergent Problem Solving: Slow processing;Requires verbal cues General Comments: Pt's brother present during end of session. Reports she is very "loopy" and inquired about what pain medication she is on.     Extremity/Trunk Assessment               Exercises     Shoulder Instructions       General Comments      Pertinent Vitals/ Pain       Pain Assessment: 0-10 Pain Score: 7  Pain Location: low back Pain Descriptors / Indicators: Sore Pain Intervention(s): Limited activity within patient's tolerance;Monitored during session;Repositioned  Home Living                                          Prior Functioning/Environment              Frequency Min 2X/week     Progress Toward Goals  OT Goals(current goals can now be found in the care plan section)  Progress towards OT goals: Not progressing toward goals - comment (Pain medication impacting cognitive abilities)  Acute Rehab OT Goals Patient Stated Goal: to stay another night; doesn't think she's ready to go home OT Goal Formulation: With patient Time For Goal Achievement: 01/14/14 Potential to Achieve Goals: Good ADL Goals Pt Will Perform Lower Body Bathing: with supervision;with adaptive equipment;sit to/from stand Pt Will Transfer to Toilet: with supervision;ambulating;regular height toilet Pt Will Perform Toileting - Clothing Manipulation  and hygiene: with supervision;sit to/from stand Pt Will Perform Tub/Shower Transfer: Shower transfer;with min guard assist;ambulating;shower seat;grab bars;rolling walker Additional ADL Goal #1: Pt will verbalize 3/3 back precautions with 100% accuracy prior to discharge. Additional ADL Goal #2: Pt will don/doff brace independently seated EOB.  Plan Discharge plan needs to be updated    Co-evaluation                 End of Session Equipment Utilized During Treatment: Gait belt;Rolling walker;Back brace   Activity Tolerance Patient tolerated treatment well   Patient Left in chair;with call bell/phone within reach;with chair alarm set;with family/visitor present   Nurse Communication Mobility status;Precautions;Other (comment) (Pt's brother requesting pain medication change)        Time: 0017-4944 OT Time Calculation (min): 36 min  Charges:    Redmond Baseman 01/01/2014, 3:12 PM

## 2014-01-01 NOTE — Progress Notes (Signed)
Patient ID: Lisa Duke, female   DOB: 1945/05/30, 68 y.o.   MRN: [id number] No weakness. C/o incisional pain. Pt/ot helping. Home Sunday?

## 2014-01-01 NOTE — Progress Notes (Signed)
Physical Therapy Treatment Patient Details Name: Lisa VONDERHAAR MRN: [id number] DOB: 01-20-1946 Today's Date: 01/01/2014    History of Present Illness 68 yo female s/p L3-4 laminectomy, facetectomy posterolateral arthrodesis. PMH: Fibromyalgia, Arthritis, depression, anxiety, BIL TKA, lumbar surgery, Rt foot surg    PT Comments    Patient making good gains with mobility and gait today.  Remains somewhat groggy - ? Due to meds.  Will instruct patient on stairs at next session.  Follow Up Recommendations  Home health PT;Supervision/Assistance - 24 hour     Equipment Recommendations  None recommended by PT    Recommendations for Other Services       Precautions / Restrictions Precautions Precautions: Back Precaution Comments: Reviewed precautions with patient.  Reviewed "no twisting" in stance at sink. Required Braces or Orthoses: Spinal Brace Spinal Brace: Lumbar corset;Applied in sitting position Restrictions Weight Bearing Restrictions: No    Mobility  Bed Mobility Overal bed mobility: Needs Assistance Bed Mobility: Rolling;Sidelying to Sit Rolling: Supervision Sidelying to sit: Supervision       General bed mobility comments: Patient using correct technique.  Supervision for safety only.  Transfers Overall transfer level: Needs assistance Equipment used: Rolling walker (2 wheeled) Transfers: Sit to/from Stand Sit to Stand: Min guard         General transfer comment: Patient using correct hand placement.  Assist for safety/balance.  Ambulation/Gait Ambulation/Gait assistance: Min guard Ambulation Distance (Feet): 220 Feet Assistive device: Rolling walker (2 wheeled) Gait Pattern/deviations: Step-through pattern;Decreased stride length Gait velocity: Decreased Gait velocity interpretation: Below normal speed for age/gender General Gait Details: Patient with slow guarded gait. Slightly unsteady with gait, leaning posteriorly at times.   Stairs            Wheelchair Mobility    Modified Rankin (Stroke Patients Only)       Balance Overall balance assessment: Needs assistance Sitting-balance support: No upper extremity supported;Feet supported Sitting balance-Leahy Scale: Fair   Postural control: Posterior lean Standing balance support: Bilateral upper extremity supported Standing balance-Leahy Scale: Poor Standing balance comment: Noted patient leaning posteriorly x2 during gait - able to self-correct with RW.                    Cognition Arousal/Alertness: Lethargic;Suspect due to medications Behavior During Therapy: [facility] for tasks assessed/performed Overall Cognitive Status: No family/caregiver present to determine baseline cognitive functioning Area of Impairment: Attention;Memory;Following commands;Safety/judgement;Awareness;Problem solving   Current Attention Level: Selective Memory: Decreased recall of precautions Following Commands: Follows one step commands with increased time Safety/Judgement: Decreased awareness of safety Awareness: Emergent Problem Solving: Slow processing General Comments: Pt's brother present during end of session. Reports she is very "loopy" and inquired about what pain medication she is on.     Exercises      General Comments        Pertinent Vitals/Pain Pain Assessment: 0-10 Pain Score: 4  Pain Location: Back Pain Descriptors / Indicators: Sore Pain Intervention(s): Limited activity within patient's tolerance;Repositioned    Home Living                      Prior Function            PT Goals (current goals can now be found in the care plan section) Acute Rehab PT Goals Patient Stated Goal: to stay another night; doesn't think she's ready to go home Progress towards PT goals: Progressing toward goals    Frequency  Min 5X/week    PT  Plan Current plan remains appropriate    Co-evaluation             End of Session Equipment Utilized During  Treatment: Gait belt;Back brace Activity Tolerance: Patient tolerated treatment well Patient left: in chair;with call bell/phone within reach;with chair alarm set     Time: 1711-1735 PT Time Calculation (min) (ACUTE ONLY): 24 min  Charges:  $Gait Training: 23-37 mins                    G Codes:      Despina Pole January 06, 2014, 5:47 PM Carita Pian. Sanjuana Kava, [place] Pager [phone]

## 2014-01-02 NOTE — Progress Notes (Signed)
Occupational Therapy Treatment Patient Details Name: Lisa Duke MRN: [id number] DOB: [birth year] Today's Date: 01/02/2014    History of present illness 68 yo female s/p L3-4 laminectomy, facetectomy posterolateral arthrodesis. PMH: Fibromyalgia, Arthritis, depression, anxiety, BIL TKA, lumbar surgery, Rt foot surg   OT comments  Education provided during session. Pt appears to have slow processing and decreased memory-suspect due to pain meds.   Follow Up Recommendations  Home health OT;Supervision/Assistance - 24 hour    Equipment Recommendations  Other (comment) (AE)    Recommendations for Other Services      Precautions / Restrictions Precautions Precautions: Back Precaution Booklet Issued:  (one in room; wrote down additional info) Precaution Comments: reviewed precautions; pt having difficulty recalling Required Braces or Orthoses: Spinal Brace Spinal Brace: Lumbar corset;Applied in sitting position Restrictions Weight Bearing Restrictions: No       Mobility Bed Mobility Overal bed mobility: Needs Assistance Bed Mobility: Rolling;Sidelying to Sit;Sit to Sidelying Rolling: Min assist Sidelying to sit: Mod assist     Sit to sidelying: Mod assist General bed mobility comments: assist to try to keep pt on side and assist with LE's when going to sidelying.  Cues for technique for bed mobility.  Transfers Overall transfer level: Needs assistance Equipment used: Rolling walker (2 wheeled) Transfers: Sit to/from Stand Sit to Stand: Min guard         General transfer comment: cues for technique.    Balance                                   ADL Overall ADL's : Needs assistance/impaired     Grooming: Oral care;Set up;Supervision/safety;Standing           Upper Body Dressing : Moderate assistance;Sitting Upper Body Dressing Details (indicate cue type and reason): back brace Lower Body Dressing: Minimal assistance;Sitting/lateral  leans;With adaptive equipment (socks; Mod A for one sock without AE)   Toilet Transfer: Min guard;Ambulation;RW;Supervision/safety (chair)           Functional mobility during ADLs: Min guard;Supervision/safety;Rolling walker General ADL Comments: Educated on safety tips (use of bag on walker, safe shoewear, discussed rugs). Pt attempted to donn/doff sock sitting EOB and OT assisted with donning sock as she appeared to be breaking precautions. Educated on AE and pt practiced donning/doffing socks with reacher and sockaid. Pt's spouse came in towards end of session and OT briefly discussed AE. Discussed positioning of pillows. Educated on use of cup for oral care and placement of grooming items to avoid breaking precautions.  Educated on back brace. Wrote down some information on handout to help pt remember. Discussed incorporating back precautions with functional activity. Pt wanted to ambulate some so ambulated in hallway.      Vision                     Perception     Praxis      Cognition   Behavior During Therapy: [facility] for tasks assessed/performed Overall Cognitive Status: Within Functional Limits for tasks assessed Area of Impairment: Attention;Memory;Problem solving;Safety/judgement   Current Attention Level: Selective Memory: Decreased recall of precautions;Decreased short-term memory    Safety/Judgement: Decreased awareness of safety   Problem Solving: Slow processing General Comments: suspect due to pain meds    Extremity/Trunk Assessment               Exercises     Shoulder Instructions  General Comments      Pertinent Vitals/ Pain       Pain Assessment: 0-10 Pain Score: 1  Pain Location: back (when standing) Pain Intervention(s): Monitored during session;Repositioned  Home Living                                          Prior Functioning/Environment              Frequency Min 2X/week     Progress Toward  Goals  OT Goals(current goals can now be found in the care plan section)  Progress towards OT goals: Progressing toward goals  Acute Rehab OT Goals Patient Stated Goal: not stated OT Goal Formulation: With patient Time For Goal Achievement: 01/14/14 Potential to Achieve Goals: Good ADL Goals Pt Will Perform Grooming: with set-up;standing Pt Will Perform Lower Body Bathing: with supervision;with adaptive equipment;sit to/from stand Pt Will Perform Lower Body Dressing: with set-up;with supervision;with adaptive equipment;sit to/from stand Pt Will Transfer to Toilet: with supervision;ambulating;regular height toilet Pt Will Perform Toileting - Clothing Manipulation and hygiene: with supervision;sit to/from stand Pt Will Perform Tub/Shower Transfer: Shower transfer;with min guard assist;ambulating;shower seat;grab bars;rolling walker Additional ADL Goal #1: Pt will verbalize 3/3 back precautions with 100% accuracy prior to discharge. Additional ADL Goal #2: Pt will don/doff brace independently seated EOB.  Plan Discharge plan remains appropriate    Co-evaluation                 End of Session Equipment Utilized During Treatment: Gait belt;Rolling walker;Back brace   Activity Tolerance Patient tolerated treatment well   Patient Left in bed;with call bell/phone within reach;with bed alarm set;with family/visitor present   Nurse Communication          Time: 1035-1110 OT Time Calculation (min): 35 min  Charges: OT General Charges $OT Visit: 1 Procedure OT Treatments $Self Care/Home Management : 8-22 mins $Therapeutic Activity: 8-22 mins  Benito Mccreedy OTR/L [phone] 01/02/2014, 11:25 AM

## 2014-01-02 NOTE — Progress Notes (Signed)
Physical Therapy Treatment Patient Details Name: Lisa Duke MRN: [id number] DOB: [birth year] Today's Date: 01/02/2014    History of Present Illness 68 yo female s/p L3-4 laminectomy, facetectomy posterolateral arthrodesis. PMH: Fibromyalgia, Arthritis, depression, anxiety, BIL TKA, lumbar surgery, Rt foot surg    PT Comments    D/c plan updated to SNF.  Pt continues to be very lethargic and demo poor safety awareness, suspected to be due to medication.  Her husband works during day and is unable to provide 24-hour assist.  Follow Up Recommendations  SNF;Supervision/Assistance - 24 hour     Equipment Recommendations  None recommended by PT    Recommendations for Other Services       Precautions / Restrictions Precautions Precautions: Back Precaution Booklet Issued:  (one in room; wrote down additional info) Precaution Comments: Reviewed back precautions.  Pt independently recalled 2/3. Required Braces or Orthoses: Spinal Brace Spinal Brace: Lumbar corset;Applied in sitting position Restrictions Weight Bearing Restrictions: No    Mobility  Bed Mobility Overal bed mobility: Needs Assistance Bed Mobility: Rolling;Sidelying to Sit;Sit to Sidelying Rolling: Min guard Sidelying to sit: Min assist     Sit to sidelying: Min assist General bed mobility comments: cues for logroll  Transfers Overall transfer level: Needs assistance Equipment used: Rolling walker (2 wheeled) Transfers: Sit to/from Stand Sit to Stand: Min guard         General transfer comment: cues for technique.  Ambulation/Gait Ambulation/Gait assistance: Min guard Ambulation Distance (Feet): 250 Feet Assistive device: Rolling walker (2 wheeled) Gait Pattern/deviations: Step-through pattern;Decreased stride length Gait velocity: Decreased   General Gait Details: Patient with slow guarded gait. Slightly unsteady with gait, leaning posteriorly at times.   Stairs Stairs: Yes Stairs  assistance: Min guard Stair Management: Forwards;Two rails;Step to pattern Number of Stairs: 3    Wheelchair Mobility    Modified Rankin (Stroke Patients Only)       Balance                                    Cognition Arousal/Alertness: Lethargic;Suspect due to medications Behavior During Therapy: [facility] for tasks assessed/performed Overall Cognitive Status: Within Functional Limits for tasks assessed Area of Impairment: Attention;Memory;Problem solving;Safety/judgement   Current Attention Level: Selective Memory: Decreased recall of precautions;Decreased short-term memory   Safety/Judgement: Decreased awareness of safety   Problem Solving: Slow processing General Comments: suspect due to pain meds    Exercises      General Comments        Pertinent Vitals/Pain Pain Assessment: 0-10 Pain Score: 2  Pain Location: back Pain Intervention(s): Monitored during session    Home Living                      Prior Function            PT Goals (current goals can now be found in the care plan section) Acute Rehab PT Goals Patient Stated Goal: not stated Progress towards PT goals: Progressing toward goals    Frequency  Min 5X/week    PT Plan Discharge plan needs to be updated    Co-evaluation             End of Session Equipment Utilized During Treatment: Gait belt;Back brace Activity Tolerance: Patient tolerated treatment well Patient left: in chair;with call bell/phone within reach;with family/visitor present;with chair alarm set     Time: 3568-6168 PT Time Calculation (min) (  ACUTE ONLY): 26 min  Charges:  $Gait Training: 23-37 mins                    G Codes:      Lorriane Shire 01/02/2014, 1:40 PM

## 2014-01-02 NOTE — Progress Notes (Signed)
Patient ID: Lisa Duke, female   DOB: 01-21-1946, 68 y.o.   MRN: [id number] Subjective:  The patient is alert and pleasant. Her back is appropriately sore.  Objective: Vital signs in last 24 hours: Temp:  [97.8 F (36.6 C)-98.6 F (37 C)] 97.8 F (36.6 C) (11/21 0950) Pulse Rate:  [65-78] 71 (11/21 0950) Resp:  [17-20] 17 (11/21 0950) BP: (106-144)/(45-61) 106/53 mmHg (11/21 0950) SpO2:  [92 %-99 %] 97 % (11/21 0950)  Intake/Output from previous day: 11/20 0701 - 11/21 0700 In: 480 [P.O.:480] Out: -  Intake/Output this shift:    Physical exam patient is alert and oriented. She is moving her lower extremities well. Her wound is healing well.  Lab Results: No results for input(s): WBC, HGB, HCT, PLT in the last 72 hours. BMET No results for input(s): NA, K, CL, CO2, GLUCOSE, BUN, CREATININE, CALCIUM in the last 72 hours.  Studies/Results: No results found.  Assessment/Plan: Postop day #3: We will continue to mobilize the patient.  LOS: 3 days     Siobahn Worsley D 01/02/2014, 10:40 AM

## 2014-01-02 NOTE — Care Management Note (Addendum)
    Page 1 of 1   01/02/2014     11:58:20 AM CARE MANAGEMENT NOTE 01/02/2014  Patient:  Lisa Duke, Lisa Duke   Account Number:  [id number]  Date Initiated:  01/01/2014  Documentation initiated by:  Olga Coaster  Subjective/Objective Assessment:   ADMITTED FOR SURGERY     Action/Plan:   CM FOLLOWING FOR DCP   Anticipated DC Date:  01/05/2014   Anticipated DC Plan:  [place]  CM consult      Choice offered to / List presented to:             Status of service:  Completed, signed off Medicare Important Message given?  YES (If response is "NO", the following Medicare IM given date fields will be blank) Date Medicare IM given:  01/01/2014 Medicare IM given by:  Olga Coaster Date Additional Medicare IM given:   Additional Medicare IM given by:    Discharge Disposition:  [place]  Per UR Regulation:  Reviewed for med. necessity/level of care/duration of stay  If discussed at [place] of Stay Meetings, dates discussed:    Comments:  01/02/14 11:50 CM spoke with pt and pt's husband, Lisa Duke [phone] who states he cannot provide 24-7 care bc he must work and cannot hire out of pocket.  CM has called CSW, Tamela Oddi to notify of family desire to have pt go to SNF for rehab.  Robert requests SNF be chosen close to his high pt home address.  No other CM needs were communicated. Mariane Masters, BSN, [place].  11/20/2015Mindi Slicker RN,BSN,MHA [phone]

## 2014-01-03 MED ORDER — PREDNISONE 5 MG PO TABS
10.0000 mg | ORAL_TABLET | Freq: Every day | ORAL | Status: DC
  Administered 2014-01-03 – 2014-01-05 (×3): 10 mg via ORAL
  Filled 2014-01-03 (×3): qty 2

## 2014-01-03 MED ORDER — PREDNISONE 5 MG PO TABS: 10.0000 mg | ORAL_TABLET | Freq: Every day | ORAL | Status: DC

## 2014-01-03 NOTE — Clinical Social Work Placement (Signed)
     Clinical Social Work Department CLINICAL SOCIAL WORK PLACEMENT NOTE 01/03/2014  Patient:  ALICIANA, RICCIARDI  Account Number:  [id number] Admit date:  12/30/2013  Clinical Social Worker:  Berton Mount, Latanya Presser  Date/time:  01/03/2014 02:46 PM  Clinical Social Work is seeking post-discharge placement for this patient at the following level of care:   [facility]   (*CSW will update this form in Epic as items are completed)   01/03/2014  Patient/family provided with [facility] Department of Clinical Social Works list of facilities offering this level of care within the geographic area requested by the patient (or if unable, by the patients family).  01/03/2014  Patient/family informed of their freedom to choose among providers that offer the needed level of care, that participate in Medicare, Medicaid or managed care program needed by the patient, have an available bed and are willing to accept the patient.  01/03/2014  Patient/family informed of [facility] ownership interest in [facility], as well as of the fact that they are under no obligation to receive care at this facility.  PASARR submitted to EDS on 01/03/2014 PASARR number received on 01/03/2014  FL2 transmitted to all facilities in geographic area requested by pt/family on  01/03/2014 FL2 transmitted to all facilities within larger geographic area on   Patient informed that his/her managed care company has contracts with or will negotiate with  certain facilities, including the following:     Patient/family informed of bed offers received:   Patient chooses bed at  Physician recommends and patient chooses bed at    Patient to be transferred to  on   Patient to be transferred to facility by  Patient and family notified of transfer on  Name of family member notified:    The following physician request were entered in Epic: Physician Request  Please sign FL2.    Additional  CommentsBerton Mount, [place]

## 2014-01-03 NOTE — Progress Notes (Signed)
Physical Therapy Treatment Patient Details Name: Lisa Duke MRN: [id number] DOB: 02-18-1945 Today's Date: 01/03/2014    History of Present Illness      PT Comments    Pt reporting increased pain today hindering mobility.  She was only able to ambulate 25 feet before requesting w/c.  Recliner retrieved from room by RN.  Follow Up Recommendations  SNF;Supervision/Assistance - 24 hour     Equipment Recommendations  None recommended by PT    Recommendations for Other Services       Precautions / Restrictions Precautions Precautions: Back Precaution Comments: Reviewed back precautions.  Pt independently recalled 2/3. Required Braces or Orthoses: Spinal Brace Spinal Brace: Lumbar corset;Applied in sitting position    Mobility  Bed Mobility     Rolling: Min guard Sidelying to sit: Min assist;HOB elevated       General bed mobility comments: cues for logroll  Transfers Overall transfer level: Needs assistance Equipment used: Rolling walker (2 wheeled)   Sit to Stand: Min assist         General transfer comment: cues for technique.  Ambulation/Gait Ambulation/Gait assistance: Min assist Ambulation Distance (Feet): 25 Feet Assistive device: Rolling walker (2 wheeled) Gait Pattern/deviations: Step-through pattern;Decreased stride length Gait velocity: Decreased   General Gait Details: Patient with slow guarded gait. Slightly unsteady   Stairs            Wheelchair Mobility    Modified Rankin (Stroke Patients Only)       Balance                                    Cognition Arousal/Alertness: Awake/alert Behavior During Therapy: WFL for tasks assessed/performed Overall Cognitive Status: Impaired/Different from baseline Area of Impairment: Safety/judgement;Memory;Attention   Current Attention Level: Selective Memory: Decreased recall of precautions;Decreased short-term memory   Safety/Judgement: Decreased awareness of  safety   Problem Solving: Slow processing;Requires verbal cues General Comments: suspect due to pain meds    Exercises      General Comments        Pertinent Vitals/Pain Pain Assessment: 0-10 Pain Score: 9  Pain Location: back and left hip Pain Descriptors / Indicators: Stabbing Pain Intervention(s): Limited activity within patient's tolerance;Patient requesting pain meds-RN notified    Home Living                      Prior Function            PT Goals (current goals can now be found in the care plan section) Progress towards PT goals: Not progressing toward goals - comment (pain hindering progress today)    Frequency  Min 5X/week    PT Plan Current plan remains appropriate    Co-evaluation             End of Session Equipment Utilized During Treatment: Gait belt;Back brace Activity Tolerance: Patient limited by pain Patient left: in chair;with call bell/phone within reach     Time: 0904-0930 PT Time Calculation (min) (ACUTE ONLY): 26 min  Charges:  $Gait Training: 8-22 mins $Therapeutic Activity: 8-22 mins                    G Codes:      Lorriane Shire 01/03/2014, 9:57 AM

## 2014-01-03 NOTE — Clinical Social Work Psychosocial (Signed)
     Clinical Social Work Department BRIEF PSYCHOSOCIAL ASSESSMENT 01/03/2014  Patient:  Lisa, Duke     Account Number:  [id number]     Admit date:  12/30/2013  Clinical Social Worker:  Adair Laundry  Date/Time:  01/03/2014 01:30 PM  Referred by:  Care Management  Date Referred:  01/02/2014 Referred for  SNF Placement   Other Referral:   Interview type:  Patient Other interview type:   Spoke with pt at bedside and per pt request also spoke with pt husband over the phone    PSYCHOSOCIAL DATA Living Status:  [place] Admitted from facility:   Level of care:   Primary support name:  Lisa Duke [phone] Primary support relationship to patient:  SPOUSE Degree of support available:   Pt has good support    CURRENT CONCERNS Current Concerns  Post-Acute Placement   Other Concerns:    SOCIAL WORK ASSESSMENT / PLAN CSW made aware by [facility] that pt was planning to dc home but is now agreeable to SNF. CSW visited pt room and spoke with pt about [place] re[facility]. Pt informed CSW she is agreeable and she thinks her husband has already found a place. CSW explained CSW role in finding placement. Pt understanding and asked CSW to speak with her husband. CSW called pt husband Lisa Duke and discussed [place] rehab. Pt husband informed CSW he is hoping for pt to be close to home in [place] [facility] [facility]. He informed CSW that he had gotten good recommendations for [facility], [facility], and [facility]. Pt husband asking about [place] facilities. After reviewing options, pt husband expressed potential preference for [facility]. CSW notiifed that [place] CSW would provide with bed offers. Pt husband thankful for information.    Pt did informed CSW she has DME delivered to room but does not feel she needs bedside commode. CSW to leave message for weekday CSW to notify weekday RNCM.   Assessment/plan status:  Psychosocial Support/Ongoing Assessment of Needs Other assessment/ plan:   Information/referral to  community resources:   SNF list provided verbally    PATIENTS/FAMILYS RESPONSE TO PLAN OF CARE: Pt and pt husband pleasant and cooperative. Both are agreeable to [facility].    [place], [place]

## 2014-01-03 NOTE — Progress Notes (Signed)
Patient ID: Lisa Duke, female   DOB: 06-11-1945, 68 y.o.   MRN: [id number] Subjective:  The patient is alert and pleasant. She complains of worsening arthralgias.  Objective: Vital signs in last 24 hours: Temp:  [97.3 F (36.3 C)-98.3 F (36.8 C)] 98 F (36.7 C) (11/22 1011) Pulse Rate:  [61-72] 63 (11/22 1011) Resp:  [18-20] 20 (11/22 1011) BP: (90-131)/(35-57) 117/57 mmHg (11/22 1011) SpO2:  [93 %-98 %] 96 % (11/22 1011)  Intake/Output from previous day: 11/21 0701 - 11/22 0700 In: 3 [I.V.:3] Out: -  Intake/Output this shift:    Physical exam the patient is alert and oriented 3. She is moving her lower extremities well.  Lab Results: No results for input(s): WBC, HGB, HCT, PLT in the last 72 hours. BMET No results for input(s): NA, K, CL, CO2, GLUCOSE, BUN, CREATININE, CALCIUM in the last 72 hours.  Studies/Results: No results found.  Assessment/Plan: Postop day #4: I will start the patient on prednisone. We will continue to mobilize the patient. She will likely need rehabilitation.  LOS: 4 days     Jamielyn Petrucci D 01/03/2014, 10:36 AM

## 2014-01-04 MED ORDER — CALCIUM CARBONATE ANTACID 500 MG PO CHEW: 2.0000 | CHEWABLE_TABLET | Freq: Two times a day (BID) | ORAL | Status: DC | PRN

## 2014-01-04 NOTE — Progress Notes (Signed)
Physical Therapy Treatment Patient Details Name: Lisa Duke MRN: [id number] DOB: December 14, 1945 Today's Date: 01/04/2014    History of Present Illness 68 yo female s/p L3-4 laminectomy, facetectomy posterolateral arthrodesis. PMH: Fibromyalgia, Arthritis, depression, anxiety, BIL TKA, lumbar surgery, Rt foot surg    PT Comments    Patient progressing well with mobility. Increased pain in left hip today making transitions from sit to/from stand and bed mobility challenging requiring more assist. Only able to recall 2/3 back precautions today. Improved ambulation distance from previous session. Pt to be d/ced today to SNF. Will continue to follow and progress as tolerated.   Follow Up Recommendations  SNF;Supervision/Assistance - 24 hour     Equipment Recommendations  None recommended by PT    Recommendations for Other Services       Precautions / Restrictions Precautions Precautions: Back Precaution Comments: Reviewed precautions with pt (Simultaneous filing. User may not have seen previous data.) Required Braces or Orthoses: Spinal Brace Spinal Brace: Lumbar corset;Applied in sitting position Restrictions Weight Bearing Restrictions: No (Simultaneous filing. User may not have seen previous data.)    Mobility  Bed Mobility Overal bed mobility: Needs Assistance Bed Mobility: Rolling;Sidelying to Sit;Sit to Sidelying Rolling: Min guard Sidelying to sit: Min guard;HOB elevated     Sit to sidelying: Min guard General bed mobility comments: VC's for log roll technique. Increased time.   Transfers Overall transfer level: Needs assistance Equipment used: Rolling walker (2 wheeled) Transfers: Sit to/from Stand Sit to Stand: Mod assist;Min guard         General transfer comment: Mod A to stand from EOB - required multiple attempts due to pain in left hip. Min guard to stand from toilet as pt pulling up with grab bars.  Ambulation/Gait Ambulation/Gait assistance: Min  assist Ambulation Distance (Feet): 200 Feet Assistive device: Rolling walker (2 wheeled) Gait Pattern/deviations: Step-through pattern;Decreased stride length Gait velocity: Decreased   General Gait Details: Unsteady, slow gait with VC's for RW proximity.   Stairs            Wheelchair Mobility    Modified Rankin (Stroke Patients Only)       Balance Overall balance assessment: Needs assistance Sitting-balance support: Feet supported;No upper extremity supported Sitting balance-Leahy Scale: Fair     Standing balance support: During functional activity;Bilateral upper extremity supported Standing balance-Leahy Scale: Poor Standing balance comment: Requires BUE support on RW for safety. Pt leaning posteriorly x2 during gait.                    Cognition Arousal/Alertness: Awake/alert Behavior During Therapy: WFL for tasks assessed/performed Overall Cognitive Status: Impaired/Different from baseline Area of Impairment: Safety/judgement;Memory;Problem solving   Current Attention Level: Selective Memory: Decreased recall of precautions;Decreased short-term memory   Safety/Judgement: Decreased awareness of safety   Problem Solving: Requires verbal cues      Exercises      General Comments        Pertinent Vitals/Pain Pain Assessment: 0-10 Pain Score: 8  Pain Location: left hip Pain Descriptors / Indicators: Sharp;Sore Pain Intervention(s): Limited activity within patient's tolerance;Monitored during session;Premedicated before session;Repositioned    Home Living                      Prior Function            PT Goals (current goals can now be found in the care plan section) Progress towards PT goals: Progressing toward goals    Frequency  Min  5X/week    PT Plan Current plan remains appropriate    Co-evaluation             End of Session Equipment Utilized During Treatment: Gait belt;Back brace Activity Tolerance: Patient  limited by pain Patient left: in bed;with call bell/phone within reach;with bed alarm set     Time: 1540-0867 PT Time Calculation (min) (ACUTE ONLY): 31 min  Charges:  $Gait Training: 8-22 mins $Therapeutic Activity: 8-22 mins                    G CodesCandy Sledge A 2014/01/13, 10:43 AM  [name], [place], DPT [phone]

## 2014-01-04 NOTE — Progress Notes (Addendum)
Occupational Therapy Treatment Patient Details Name: Lisa Duke MRN: [id number] DOB: 1945-08-06 Today's Date: 01/04/2014    History of present illness 68 yo female s/p L3-4 laminectomy, facetectomy posterolateral arthrodesis. PMH: Fibromyalgia, Arthritis, depression, anxiety, BIL TKA, lumbar surgery, Rt foot surg   OT comments  Pt wanting to go home and thinks she can't tolerate going to SNF. OT explained safety concern with going home without 24/7 assist. If pt discharges home, recommend Lisa Duke and 24/7 supervision.   Follow Up Recommendations  SNF;Supervision/Assistance - 24 hour    Equipment Recommendations  None recommended by OT (defer to next venue)    Recommendations for Other Services      Precautions / Restrictions Precautions Precautions: Back Precaution Comments: Reviewed precautions with pt Required Braces or Orthoses: Spinal Brace Spinal Brace: Lumbar corset;Applied in sitting position Restrictions Weight Bearing Restrictions: No       Mobility Bed Mobility Overal bed mobility: Needs Assistance Bed Mobility: Rolling;Sit to Sidelying Rolling: Min guard     Sit to sidelying: Min assist General bed mobility comments: cues for technique. Assist to try to keep pt on her side when getting back in bed.  Transfers Overall transfer level: Needs assistance Equipment used: Rolling walker (2 wheeled) Transfers: Sit to/from Stand Sit to Stand: Min guard;Min assist         General transfer comment: assist to stand from recliner x1, otherwise Min guard assist.    Balance                                   ADL Overall ADL's : Needs assistance/impaired     Grooming: Oral care;Wash/dry face;Brushing hair;Min guard;Standing           Upper Body Dressing : Set up;Supervision/safety;Sitting (doffed back brace)   Lower Body Dressing: Set up;Supervision/safety;Sitting/lateral leans;With adaptive equipment (socks)   Toilet Transfer: Min  guard;Ambulation;Comfort height toilet;RW;Grab bars   Toileting- Clothing Manipulation and Hygiene: Min guard;Sitting/lateral lean;Sit to/from stand (clothing management-did not urinate)       Functional mobility during ADLs: Min guard;Rolling walker General ADL Comments: Educated on AE for LB ADLs. Educated on use of cup for teeth care and placement of grooming items. Reviewed to use bag on walker. Postioning pillow under legs at end of session. Pt performed grooming at sink in bathroom.  Pt able to cross leg over knee for LB dressing- but cues given for precautions and practiced with AE (already bought kit). Reviewed back brace information. Educated on safety.      Vision                     Perception     Praxis      Cognition   Behavior During Therapy: [facility] for tasks assessed/performed Overall Cognitive Status: Impaired/Different from baseline Area of Impairment: Safety/judgement;Attention   Current Attention Level: Selective Memory: Decreased recall of precautions    Safety/Judgement: Decreased awareness of safety   Problem Solving: Slow processing      Extremity/Trunk Assessment               Exercises     Shoulder Instructions       General Comments      Pertinent Vitals/ Pain       Pain Assessment: 0-10 Pain Score: 8  Pain Location: LLE Pain Descriptors / Indicators: Sharp Pain Intervention(s): Monitored during session;Repositioned  Home Living  Prior Functioning/Environment              Frequency Min 2X/week     Progress Toward Goals  OT Goals(current goals can now be found in the care plan section)  Progress towards OT goals: Progressing toward goals  Acute Rehab OT Goals Patient Stated Goal: go home OT Goal Formulation: With patient Time For Goal Achievement: 01/14/14 Potential to Achieve Goals: Good ADL Goals Pt Will Perform Grooming: with set-up;standing Pt  Will Perform Lower Body Bathing: with supervision;with adaptive equipment;sit to/from stand Pt Will Perform Lower Body Dressing: with set-up;with supervision;with adaptive equipment;sit to/from stand Pt Will Transfer to Toilet: with supervision;ambulating;regular height toilet Pt Will Perform Toileting - Clothing Manipulation and hygiene: with supervision;sit to/from stand Pt Will Perform Tub/Shower Transfer: Shower transfer;with min guard assist;ambulating;shower seat;grab bars;rolling walker Additional ADL Goal #1: Pt will verbalize 3/3 back precautions with 100% accuracy prior to discharge. Additional ADL Goal #2: Pt will don/doff brace independently seated EOB.  Plan Discharge plan needs to be updated    Co-evaluation                 End of Session Equipment Utilized During Treatment: Gait belt;Rolling walker;Back brace   Activity Tolerance Patient limited by pain   Patient Left in bed;with call bell/phone within reach;with bed alarm set   Nurse Communication          Time: [phone] OT Time Calculation (min): 32 min  Charges: OT General Charges $OT Visit: 1 Procedure OT Treatments $Self Care/Home Management : 23-37 mins  Benito Mccreedy OTR/L [phone] 01/04/2014, 10:51 AM

## 2014-01-04 NOTE — Progress Notes (Signed)
Ambulated pt in room to bathroom approx 30 with brace on and walker. Pt tolerated well.  Demonstrated proper placement of brace.  Angeline Slim I 01/04/2014 6:54 PM

## 2014-01-04 NOTE — Progress Notes (Signed)
Patient ID: Lisa Duke, female   DOB: 1946-01-24, 68 y.o.   MRN: [id number] Doing well. Husband wants her to go to a SNF. Social worker to help

## 2014-01-04 NOTE — Clinical Social Work Note (Addendum)
CSW met pt at bedside. CSW introduced self and purpose of visit. CSW provided 9 bed offers to the pt. Pt reported that she needed to talk to her husband. Pt attempted to call her husband, but he did not answer. Pt requested CSW to call her husband at [phone]. CSW left a voice message. CSW is awaiting a decision for the pt and her husband.   Addendum: CSW updated by the pt and her husband. They agreed to transition to [facility]. CSW confirmed bed at [facility] and they can accepted the pt.   Addendum: CSW received a call from pt's husband requesting to change SNF placement to [facility]. CSW called Claiborne Billings at [facility] to confirmed bed. CSW informed MD that the pt has questions regarding discharge.   [place], MSW, [place]

## 2014-01-05 NOTE — Clinical Social Work Note (Signed)
CSW reviewed chart. Pt declined SNF. Pt will transition home. CSW will sign off.   [place], MSW, [place]

## 2014-01-05 NOTE — Progress Notes (Signed)
D/C orders received. Pt stated that she did not want to go to SNF or have home health therapy due to her rheumatoid arthritis pain. Pt and spouse educated on d/c instructions and spinal precautions. Verbalized understanding. Pt handed d/c packet. Pt taken downstairs by staff via wheelchair.

## 2014-01-05 NOTE — Progress Notes (Addendum)
Physical Therapy Treatment Patient Details Name: ASTOU LADA MRN: [id number] DOB: 1945-05-05 Today's Date: 01/05/2014    History of Present Illness 68 yo female s/p L3-4 laminectomy, facetectomy posterolateral arthrodesis. PMH: Fibromyalgia, Arthritis, depression, anxiety, BIL TKA, lumbar surgery, Rt foot surg    PT Comments    Patient is now planning to DC home today. Able to walk unit and practice steps. Educated on brace wear and precautions. Husband to assist at home.   Follow Up Recommendations  Supervision/Assistance - 24 hour;Home health PT     Equipment Recommendations  None recommended by PT    Recommendations for Other Services       Precautions / Restrictions Precautions Precautions: Back Precaution Comments: Reviewed precautions with pt Required Braces or Orthoses: Spinal Brace Spinal Brace: Lumbar corset;Applied in sitting position Restrictions Weight Bearing Restrictions: No    Mobility  Bed Mobility Overal bed mobility: Needs Assistance   Rolling: Supervision Sidelying to sit: Supervision     Sit to sidelying: Supervision General bed mobility comments: cues to ensure technique  Transfers Overall transfer level: Needs assistance Equipment used: Rolling walker (2 wheeled)   Sit to Stand: Supervision         General transfer comment: cues for hand placement  Ambulation/Gait Ambulation/Gait assistance: Supervision Ambulation Distance (Feet): 600 Feet Assistive device: Rolling walker (2 wheeled) Gait Pattern/deviations: Step-through pattern;Decreased stride length Gait velocity: Decreased       Stairs Stairs: Yes Stairs assistance: Min guard Stair Management: Two rails;Alternating pattern;Forwards Number of Stairs: 5 General stair comments: patient with safe technique. minguard for safety  Wheelchair Mobility    Modified Rankin (Stroke Patients Only)       Balance                                     Cognition Arousal/Alertness: Awake/alert Behavior During Therapy: WFL for tasks assessed/performed Overall Cognitive Status: Impaired/Different from baseline Area of Impairment: Safety/judgement;Attention       Following Commands: Follows one step commands with increased time Safety/Judgement: Decreased awareness of safety   Problem Solving: Slow processing      Exercises      General Comments        Pertinent Vitals/Pain Pain Assessment: No/denies pain    Home Living                      Prior Function            PT Goals (current goals can now be found in the care plan section) Progress towards PT goals: Progressing toward goals    Frequency  Min 5X/week    PT Plan Discharge plan needs to be updated    Co-evaluation             End of Session Equipment Utilized During Treatment: Gait belt;Back brace Activity Tolerance: Patient tolerated treatment well Patient left: in bed;with call bell/phone within reach     Time: 6301-6010 PT Time Calculation (min) (ACUTE ONLY): 27 min  Charges:  $Gait Training: 8-22 mins $Therapeutic Activity: 8-22 mins                    G Codes:      Jacqualyn Posey 01/05/2014, 12:01 PM  01/05/2014 Jacqualyn Posey PTA [phone] pager [phone] office

## 2014-01-05 NOTE — Discharge Summary (Signed)
Physician Discharge Summary  Patient ID: Lisa Duke MRN: [id number] DOB/AGE: 69/28/1947 69 y.o.  Admit date: 12/30/2013 Discharge date: 01/05/2014  Admission Diagnoses:lumbar stenosis  Discharge Diagnoses:  Active Problems:   Lumbar stenosis with neurogenic claudication   Discharged Condition:no pain or weakness  Hospital Course: surgery  Consults: none  Significant Diagnostic Studies: myelogram  Treatments: lumbar decompression and fusion  Discharge Exam: Blood pressure 174/89, pulse 69, temperature 98.6 F (37 C), temperature source Oral, resp. rate 18, height 5\' 5"  (1.651 m), weight 85.276 kg (188 lb), SpO2 99 %. Ambulating. No weakness  Disposition: home. To see me in 2 weeks     Medication List    ASK your doctor about these medications        acebutolol 400 MG capsule  Commonly known as:  SECTRAL  Take 400 mg by mouth at bedtime.     aspirin EC 81 MG tablet  Take 81 mg by mouth daily.     budesonide 180 MCG/ACT inhaler  Commonly known as:  PULMICORT  Inhale 2 puffs into the lungs as needed (asthma).     clonazePAM 2 MG tablet  Commonly known as:  KLONOPIN  Take 2 mg by mouth 3 (three) times daily.     diazepam 5 MG tablet  Commonly known as:  VALIUM  Take 5 mg by mouth every 6 (six) hours as needed (take with flexeril).     DULoxetine 60 MG capsule  Commonly known as:  CYMBALTA  Take 60 mg by mouth every morning.     folic acid 1 MG tablet  Commonly known as:  FOLVITE  Take 3 mg by mouth every morning.     levothyroxine 100 MCG tablet  Commonly known as:  SYNTHROID, LEVOTHROID  Take 100 mcg by mouth every morning.     methotrexate 2.5 MG tablet  Commonly known as:  RHEUMATREX  Take 12.5 mg by mouth every Monday.     nortriptyline 50 MG capsule  Commonly known as:  PAMELOR  Take 50 mg by mouth at bedtime.     oxybutynin 3.9 MG/24HR  Commonly known as:  OXYTROL  Place 1 patch onto the skin See admin instructions. Applies every  4-5 days.     oxyCODONE 15 MG immediate release tablet  Commonly known as:  ROXICODONE  Take 15 mg by mouth every 4 (four) hours as needed for pain.     predniSONE 5 MG tablet  Commonly known as:  DELTASONE  Take 5-15 mg by mouth daily as needed (for RA flare ups. Dose tapers from 15mg  x3 days, 10mg  x3 days, 5 mg x3 days).         Signed: Floyce Stakes 01/05/2014, 8:08 AM

## 2014-01-14 DIAGNOSIS — M2550 Pain in unspecified joint: Secondary | ICD-10-CM | POA: Diagnosis not present

## 2014-01-14 DIAGNOSIS — M0609 Rheumatoid arthritis without rheumatoid factor, multiple sites: Secondary | ICD-10-CM | POA: Diagnosis not present

## 2014-01-14 DIAGNOSIS — M797 Fibromyalgia: Secondary | ICD-10-CM | POA: Diagnosis not present

## 2014-01-14 DIAGNOSIS — M159 Polyosteoarthritis, unspecified: Secondary | ICD-10-CM | POA: Diagnosis not present

## 2014-01-21 DIAGNOSIS — M5136 Other intervertebral disc degeneration, lumbar region: Secondary | ICD-10-CM | POA: Diagnosis not present

## 2014-01-25 DIAGNOSIS — M0589 Other rheumatoid arthritis with rheumatoid factor of multiple sites: Secondary | ICD-10-CM | POA: Diagnosis not present

## 2014-02-25 DIAGNOSIS — M0589 Other rheumatoid arthritis with rheumatoid factor of multiple sites: Secondary | ICD-10-CM | POA: Diagnosis not present

## 2014-02-25 DIAGNOSIS — R030 Elevated blood-pressure reading, without diagnosis of hypertension: Secondary | ICD-10-CM | POA: Diagnosis not present

## 2014-02-25 DIAGNOSIS — M5136 Other intervertebral disc degeneration, lumbar region: Secondary | ICD-10-CM | POA: Diagnosis not present

## 2014-02-25 DIAGNOSIS — Z6829 Body mass index (BMI) 29.0-29.9, adult: Secondary | ICD-10-CM | POA: Diagnosis not present

## 2014-03-08 DIAGNOSIS — M79671 Pain in right foot: Secondary | ICD-10-CM | POA: Diagnosis not present

## 2014-03-08 DIAGNOSIS — M2041 Other hammer toe(s) (acquired), right foot: Secondary | ICD-10-CM | POA: Diagnosis not present

## 2014-03-23 DIAGNOSIS — M0589 Other rheumatoid arthritis with rheumatoid factor of multiple sites: Secondary | ICD-10-CM | POA: Diagnosis not present

## 2014-04-05 DIAGNOSIS — Z09 Encounter for follow-up examination after completed treatment for conditions other than malignant neoplasm: Secondary | ICD-10-CM | POA: Diagnosis not present

## 2014-04-05 DIAGNOSIS — M0589 Other rheumatoid arthritis with rheumatoid factor of multiple sites: Secondary | ICD-10-CM | POA: Diagnosis not present

## 2014-04-05 DIAGNOSIS — Z9229 Personal history of other drug therapy: Secondary | ICD-10-CM | POA: Diagnosis not present

## 2014-04-20 DIAGNOSIS — M0609 Rheumatoid arthritis without rheumatoid factor, multiple sites: Secondary | ICD-10-CM | POA: Diagnosis not present

## 2014-04-20 DIAGNOSIS — M549 Dorsalgia, unspecified: Secondary | ICD-10-CM | POA: Diagnosis not present

## 2014-04-20 DIAGNOSIS — M797 Fibromyalgia: Secondary | ICD-10-CM | POA: Diagnosis not present

## 2014-04-20 DIAGNOSIS — M0589 Other rheumatoid arthritis with rheumatoid factor of multiple sites: Secondary | ICD-10-CM | POA: Diagnosis not present

## 2014-04-20 DIAGNOSIS — M2550 Pain in unspecified joint: Secondary | ICD-10-CM | POA: Diagnosis not present

## 2014-05-18 DIAGNOSIS — M0589 Other rheumatoid arthritis with rheumatoid factor of multiple sites: Secondary | ICD-10-CM | POA: Diagnosis not present

## 2014-05-18 DIAGNOSIS — M150 Primary generalized (osteo)arthritis: Secondary | ICD-10-CM | POA: Diagnosis not present

## 2014-06-28 DIAGNOSIS — M5136 Other intervertebral disc degeneration, lumbar region: Secondary | ICD-10-CM | POA: Diagnosis not present

## 2014-06-28 DIAGNOSIS — Z6830 Body mass index (BMI) 30.0-30.9, adult: Secondary | ICD-10-CM | POA: Diagnosis not present

## 2014-07-05 DIAGNOSIS — Z5181 Encounter for therapeutic drug level monitoring: Secondary | ICD-10-CM | POA: Diagnosis not present

## 2014-07-13 DIAGNOSIS — M0589 Other rheumatoid arthritis with rheumatoid factor of multiple sites: Secondary | ICD-10-CM | POA: Diagnosis not present

## 2014-09-07 DIAGNOSIS — M0589 Other rheumatoid arthritis with rheumatoid factor of multiple sites: Secondary | ICD-10-CM | POA: Diagnosis not present

## 2014-09-17 DIAGNOSIS — Z79899 Other long term (current) drug therapy: Secondary | ICD-10-CM | POA: Diagnosis not present

## 2014-09-17 DIAGNOSIS — F329 Major depressive disorder, single episode, unspecified: Secondary | ICD-10-CM | POA: Diagnosis not present

## 2014-09-17 DIAGNOSIS — G8929 Other chronic pain: Secondary | ICD-10-CM | POA: Diagnosis not present

## 2014-09-17 DIAGNOSIS — Z23 Encounter for immunization: Secondary | ICD-10-CM | POA: Diagnosis not present

## 2014-09-17 DIAGNOSIS — E039 Hypothyroidism, unspecified: Secondary | ICD-10-CM | POA: Diagnosis not present

## 2014-09-17 DIAGNOSIS — R0981 Nasal congestion: Secondary | ICD-10-CM | POA: Diagnosis not present

## 2014-09-17 DIAGNOSIS — M069 Rheumatoid arthritis, unspecified: Secondary | ICD-10-CM | POA: Diagnosis not present

## 2014-10-05 DIAGNOSIS — M150 Primary generalized (osteo)arthritis: Secondary | ICD-10-CM | POA: Diagnosis not present

## 2014-10-05 DIAGNOSIS — M2550 Pain in unspecified joint: Secondary | ICD-10-CM | POA: Diagnosis not present

## 2014-10-05 DIAGNOSIS — M797 Fibromyalgia: Secondary | ICD-10-CM | POA: Diagnosis not present

## 2014-10-05 DIAGNOSIS — M545 Low back pain: Secondary | ICD-10-CM | POA: Diagnosis not present

## 2014-10-05 DIAGNOSIS — M0579 Rheumatoid arthritis with rheumatoid factor of multiple sites without organ or systems involvement: Secondary | ICD-10-CM | POA: Diagnosis not present

## 2014-10-05 DIAGNOSIS — Z79899 Other long term (current) drug therapy: Secondary | ICD-10-CM | POA: Diagnosis not present

## 2014-11-02 DIAGNOSIS — M0579 Rheumatoid arthritis with rheumatoid factor of multiple sites without organ or systems involvement: Secondary | ICD-10-CM | POA: Diagnosis not present

## 2014-12-28 DIAGNOSIS — M0579 Rheumatoid arthritis with rheumatoid factor of multiple sites without organ or systems involvement: Secondary | ICD-10-CM | POA: Diagnosis not present

## 2015-01-11 DIAGNOSIS — M0579 Rheumatoid arthritis with rheumatoid factor of multiple sites without organ or systems involvement: Secondary | ICD-10-CM | POA: Diagnosis not present

## 2015-01-18 DIAGNOSIS — Z79899 Other long term (current) drug therapy: Secondary | ICD-10-CM | POA: Diagnosis not present

## 2015-01-18 DIAGNOSIS — M0579 Rheumatoid arthritis with rheumatoid factor of multiple sites without organ or systems involvement: Secondary | ICD-10-CM | POA: Diagnosis not present

## 2015-01-18 DIAGNOSIS — M2550 Pain in unspecified joint: Secondary | ICD-10-CM | POA: Diagnosis not present

## 2015-01-18 DIAGNOSIS — M797 Fibromyalgia: Secondary | ICD-10-CM | POA: Diagnosis not present

## 2015-01-18 DIAGNOSIS — M545 Low back pain: Secondary | ICD-10-CM | POA: Diagnosis not present

## 2015-01-20 DIAGNOSIS — F329 Major depressive disorder, single episode, unspecified: Secondary | ICD-10-CM | POA: Diagnosis not present

## 2015-01-20 DIAGNOSIS — D126 Benign neoplasm of colon, unspecified: Secondary | ICD-10-CM | POA: Diagnosis not present

## 2015-01-20 DIAGNOSIS — G8929 Other chronic pain: Secondary | ICD-10-CM | POA: Diagnosis not present

## 2015-01-20 DIAGNOSIS — E039 Hypothyroidism, unspecified: Secondary | ICD-10-CM | POA: Diagnosis not present

## 2015-01-20 DIAGNOSIS — R609 Edema, unspecified: Secondary | ICD-10-CM | POA: Diagnosis not present

## 2015-01-20 DIAGNOSIS — M069 Rheumatoid arthritis, unspecified: Secondary | ICD-10-CM | POA: Diagnosis not present

## 2015-01-20 DIAGNOSIS — Z79899 Other long term (current) drug therapy: Secondary | ICD-10-CM | POA: Diagnosis not present

## 2015-01-20 DIAGNOSIS — Z23 Encounter for immunization: Secondary | ICD-10-CM | POA: Diagnosis not present

## 2015-02-08 DIAGNOSIS — M0579 Rheumatoid arthritis with rheumatoid factor of multiple sites without organ or systems involvement: Secondary | ICD-10-CM | POA: Diagnosis not present

## 2015-02-22 DIAGNOSIS — Z5181 Encounter for therapeutic drug level monitoring: Secondary | ICD-10-CM | POA: Diagnosis not present

## 2015-03-22 DIAGNOSIS — Z79899 Other long term (current) drug therapy: Secondary | ICD-10-CM | POA: Diagnosis not present

## 2015-03-22 DIAGNOSIS — M0579 Rheumatoid arthritis with rheumatoid factor of multiple sites without organ or systems involvement: Secondary | ICD-10-CM | POA: Diagnosis not present

## 2015-05-02 DIAGNOSIS — M5136 Other intervertebral disc degeneration, lumbar region: Secondary | ICD-10-CM | POA: Diagnosis not present

## 2015-05-02 DIAGNOSIS — Z6830 Body mass index (BMI) 30.0-30.9, adult: Secondary | ICD-10-CM | POA: Diagnosis not present

## 2015-05-03 DIAGNOSIS — M0579 Rheumatoid arthritis with rheumatoid factor of multiple sites without organ or systems involvement: Secondary | ICD-10-CM | POA: Diagnosis not present

## 2015-05-12 DIAGNOSIS — M2550 Pain in unspecified joint: Secondary | ICD-10-CM | POA: Diagnosis not present

## 2015-05-12 DIAGNOSIS — M797 Fibromyalgia: Secondary | ICD-10-CM | POA: Diagnosis not present

## 2015-05-12 DIAGNOSIS — Z79899 Other long term (current) drug therapy: Secondary | ICD-10-CM | POA: Diagnosis not present

## 2015-05-12 DIAGNOSIS — M0579 Rheumatoid arthritis with rheumatoid factor of multiple sites without organ or systems involvement: Secondary | ICD-10-CM | POA: Diagnosis not present

## 2015-06-28 DIAGNOSIS — M0579 Rheumatoid arthritis with rheumatoid factor of multiple sites without organ or systems involvement: Secondary | ICD-10-CM | POA: Diagnosis not present

## 2015-08-09 DIAGNOSIS — Z79899 Other long term (current) drug therapy: Secondary | ICD-10-CM | POA: Diagnosis not present

## 2015-08-09 DIAGNOSIS — M0579 Rheumatoid arthritis with rheumatoid factor of multiple sites without organ or systems involvement: Secondary | ICD-10-CM | POA: Diagnosis not present

## 2015-08-25 DIAGNOSIS — M797 Fibromyalgia: Secondary | ICD-10-CM | POA: Diagnosis not present

## 2015-08-25 DIAGNOSIS — M545 Low back pain: Secondary | ICD-10-CM | POA: Diagnosis not present

## 2015-08-25 DIAGNOSIS — M0579 Rheumatoid arthritis with rheumatoid factor of multiple sites without organ or systems involvement: Secondary | ICD-10-CM | POA: Diagnosis not present

## 2015-08-25 DIAGNOSIS — M150 Primary generalized (osteo)arthritis: Secondary | ICD-10-CM | POA: Diagnosis not present

## 2015-08-25 DIAGNOSIS — Z79899 Other long term (current) drug therapy: Secondary | ICD-10-CM | POA: Diagnosis not present

## 2015-08-25 DIAGNOSIS — M2550 Pain in unspecified joint: Secondary | ICD-10-CM | POA: Diagnosis not present

## 2015-09-20 DIAGNOSIS — M0579 Rheumatoid arthritis with rheumatoid factor of multiple sites without organ or systems involvement: Secondary | ICD-10-CM | POA: Diagnosis not present

## 2015-11-01 DIAGNOSIS — M0579 Rheumatoid arthritis with rheumatoid factor of multiple sites without organ or systems involvement: Secondary | ICD-10-CM | POA: Diagnosis not present

## 2015-11-01 DIAGNOSIS — Z79899 Other long term (current) drug therapy: Secondary | ICD-10-CM | POA: Diagnosis not present

## 2015-11-19 DIAGNOSIS — D899 Disorder involving the immune mechanism, unspecified: Secondary | ICD-10-CM | POA: Diagnosis not present

## 2015-11-19 DIAGNOSIS — H9201 Otalgia, right ear: Secondary | ICD-10-CM | POA: Diagnosis not present

## 2015-11-19 DIAGNOSIS — H6123 Impacted cerumen, bilateral: Secondary | ICD-10-CM | POA: Diagnosis not present

## 2015-11-19 DIAGNOSIS — M069 Rheumatoid arthritis, unspecified: Secondary | ICD-10-CM | POA: Diagnosis not present

## 2015-11-19 DIAGNOSIS — G43909 Migraine, unspecified, not intractable, without status migrainosus: Secondary | ICD-10-CM | POA: Diagnosis not present

## 2015-12-28 DIAGNOSIS — M0579 Rheumatoid arthritis with rheumatoid factor of multiple sites without organ or systems involvement: Secondary | ICD-10-CM | POA: Diagnosis not present

## 2016-01-18 DIAGNOSIS — M797 Fibromyalgia: Secondary | ICD-10-CM | POA: Diagnosis not present

## 2016-01-18 DIAGNOSIS — Z6835 Body mass index (BMI) 35.0-35.9, adult: Secondary | ICD-10-CM | POA: Diagnosis not present

## 2016-01-18 DIAGNOSIS — M0579 Rheumatoid arthritis with rheumatoid factor of multiple sites without organ or systems involvement: Secondary | ICD-10-CM | POA: Diagnosis not present

## 2016-01-18 DIAGNOSIS — M150 Primary generalized (osteo)arthritis: Secondary | ICD-10-CM | POA: Diagnosis not present

## 2016-01-18 DIAGNOSIS — Z79899 Other long term (current) drug therapy: Secondary | ICD-10-CM | POA: Diagnosis not present

## 2016-01-18 DIAGNOSIS — E669 Obesity, unspecified: Secondary | ICD-10-CM | POA: Diagnosis not present

## 2016-01-18 DIAGNOSIS — M2550 Pain in unspecified joint: Secondary | ICD-10-CM | POA: Diagnosis not present

## 2016-02-01 DIAGNOSIS — Z23 Encounter for immunization: Secondary | ICD-10-CM | POA: Diagnosis not present

## 2016-02-01 DIAGNOSIS — R300 Dysuria: Secondary | ICD-10-CM | POA: Diagnosis not present

## 2016-02-01 DIAGNOSIS — F4320 Adjustment disorder, unspecified: Secondary | ICD-10-CM | POA: Diagnosis not present

## 2016-02-01 DIAGNOSIS — S6000XA Contusion of unspecified finger without damage to nail, initial encounter: Secondary | ICD-10-CM | POA: Diagnosis not present

## 2016-02-09 DIAGNOSIS — M0579 Rheumatoid arthritis with rheumatoid factor of multiple sites without organ or systems involvement: Secondary | ICD-10-CM | POA: Diagnosis not present

## 2016-02-22 DIAGNOSIS — R300 Dysuria: Secondary | ICD-10-CM | POA: Diagnosis not present

## 2016-02-22 DIAGNOSIS — M069 Rheumatoid arthritis, unspecified: Secondary | ICD-10-CM | POA: Diagnosis not present

## 2016-02-22 DIAGNOSIS — H02839 Dermatochalasis of unspecified eye, unspecified eyelid: Secondary | ICD-10-CM | POA: Diagnosis not present

## 2016-02-22 DIAGNOSIS — Z23 Encounter for immunization: Secondary | ICD-10-CM | POA: Diagnosis not present

## 2016-02-22 DIAGNOSIS — Z86010 Personal history of colonic polyps: Secondary | ICD-10-CM | POA: Diagnosis not present

## 2016-02-22 DIAGNOSIS — F419 Anxiety disorder, unspecified: Secondary | ICD-10-CM | POA: Diagnosis not present

## 2016-02-22 DIAGNOSIS — H2513 Age-related nuclear cataract, bilateral: Secondary | ICD-10-CM | POA: Diagnosis not present

## 2016-02-22 DIAGNOSIS — F338 Other recurrent depressive disorders: Secondary | ICD-10-CM | POA: Diagnosis not present

## 2016-02-22 DIAGNOSIS — E039 Hypothyroidism, unspecified: Secondary | ICD-10-CM | POA: Diagnosis not present

## 2016-04-10 DIAGNOSIS — M0579 Rheumatoid arthritis with rheumatoid factor of multiple sites without organ or systems involvement: Secondary | ICD-10-CM | POA: Diagnosis not present

## 2016-04-10 DIAGNOSIS — Z79899 Other long term (current) drug therapy: Secondary | ICD-10-CM | POA: Diagnosis not present

## 2016-04-17 DIAGNOSIS — Z5189 Encounter for other specified aftercare: Secondary | ICD-10-CM | POA: Diagnosis not present

## 2016-04-25 DIAGNOSIS — E039 Hypothyroidism, unspecified: Secondary | ICD-10-CM | POA: Diagnosis not present

## 2016-05-15 DIAGNOSIS — M0579 Rheumatoid arthritis with rheumatoid factor of multiple sites without organ or systems involvement: Secondary | ICD-10-CM | POA: Diagnosis not present

## 2016-05-15 DIAGNOSIS — M2550 Pain in unspecified joint: Secondary | ICD-10-CM | POA: Diagnosis not present

## 2016-05-15 DIAGNOSIS — Z79899 Other long term (current) drug therapy: Secondary | ICD-10-CM | POA: Diagnosis not present

## 2016-05-15 DIAGNOSIS — M797 Fibromyalgia: Secondary | ICD-10-CM | POA: Diagnosis not present

## 2016-05-15 DIAGNOSIS — Z6834 Body mass index (BMI) 34.0-34.9, adult: Secondary | ICD-10-CM | POA: Diagnosis not present

## 2016-05-15 DIAGNOSIS — E669 Obesity, unspecified: Secondary | ICD-10-CM | POA: Diagnosis not present

## 2016-05-22 DIAGNOSIS — M0579 Rheumatoid arthritis with rheumatoid factor of multiple sites without organ or systems involvement: Secondary | ICD-10-CM | POA: Diagnosis not present

## 2016-05-22 DIAGNOSIS — Z79899 Other long term (current) drug therapy: Secondary | ICD-10-CM | POA: Diagnosis not present

## 2016-07-03 DIAGNOSIS — M0579 Rheumatoid arthritis with rheumatoid factor of multiple sites without organ or systems involvement: Secondary | ICD-10-CM | POA: Diagnosis not present

## 2016-08-14 DIAGNOSIS — Z79899 Other long term (current) drug therapy: Secondary | ICD-10-CM | POA: Diagnosis not present

## 2016-08-14 DIAGNOSIS — M0579 Rheumatoid arthritis with rheumatoid factor of multiple sites without organ or systems involvement: Secondary | ICD-10-CM | POA: Diagnosis not present

## 2016-08-28 DIAGNOSIS — J0100 Acute maxillary sinusitis, unspecified: Secondary | ICD-10-CM | POA: Diagnosis not present

## 2016-08-28 DIAGNOSIS — H6122 Impacted cerumen, left ear: Secondary | ICD-10-CM | POA: Diagnosis not present

## 2016-09-18 DIAGNOSIS — Z6832 Body mass index (BMI) 32.0-32.9, adult: Secondary | ICD-10-CM | POA: Diagnosis not present

## 2016-09-18 DIAGNOSIS — M797 Fibromyalgia: Secondary | ICD-10-CM | POA: Diagnosis not present

## 2016-09-18 DIAGNOSIS — R5383 Other fatigue: Secondary | ICD-10-CM | POA: Diagnosis not present

## 2016-09-18 DIAGNOSIS — M0579 Rheumatoid arthritis with rheumatoid factor of multiple sites without organ or systems involvement: Secondary | ICD-10-CM | POA: Diagnosis not present

## 2016-09-18 DIAGNOSIS — R531 Weakness: Secondary | ICD-10-CM | POA: Diagnosis not present

## 2016-09-18 DIAGNOSIS — E669 Obesity, unspecified: Secondary | ICD-10-CM | POA: Diagnosis not present

## 2016-09-18 DIAGNOSIS — R0981 Nasal congestion: Secondary | ICD-10-CM | POA: Diagnosis not present

## 2016-09-19 DIAGNOSIS — Z6829 Body mass index (BMI) 29.0-29.9, adult: Secondary | ICD-10-CM | POA: Diagnosis not present

## 2016-09-19 DIAGNOSIS — J0100 Acute maxillary sinusitis, unspecified: Secondary | ICD-10-CM | POA: Diagnosis not present

## 2016-09-19 DIAGNOSIS — E663 Overweight: Secondary | ICD-10-CM | POA: Diagnosis not present

## 2016-10-04 DIAGNOSIS — M47816 Spondylosis without myelopathy or radiculopathy, lumbar region: Secondary | ICD-10-CM | POA: Diagnosis not present

## 2016-10-30 DIAGNOSIS — M0579 Rheumatoid arthritis with rheumatoid factor of multiple sites without organ or systems involvement: Secondary | ICD-10-CM | POA: Diagnosis not present

## 2016-12-12 DIAGNOSIS — Z79899 Other long term (current) drug therapy: Secondary | ICD-10-CM | POA: Diagnosis not present

## 2016-12-12 DIAGNOSIS — M0579 Rheumatoid arthritis with rheumatoid factor of multiple sites without organ or systems involvement: Secondary | ICD-10-CM | POA: Diagnosis not present

## 2017-01-28 DIAGNOSIS — L308 Other specified dermatitis: Secondary | ICD-10-CM | POA: Diagnosis not present

## 2017-01-28 DIAGNOSIS — L821 Other seborrheic keratosis: Secondary | ICD-10-CM | POA: Diagnosis not present

## 2017-01-28 DIAGNOSIS — R21 Rash and other nonspecific skin eruption: Secondary | ICD-10-CM | POA: Diagnosis not present

## 2017-01-29 DIAGNOSIS — M0579 Rheumatoid arthritis with rheumatoid factor of multiple sites without organ or systems involvement: Secondary | ICD-10-CM | POA: Diagnosis not present

## 2017-01-31 DIAGNOSIS — E669 Obesity, unspecified: Secondary | ICD-10-CM | POA: Diagnosis not present

## 2017-01-31 DIAGNOSIS — Z79899 Other long term (current) drug therapy: Secondary | ICD-10-CM | POA: Diagnosis not present

## 2017-01-31 DIAGNOSIS — M0579 Rheumatoid arthritis with rheumatoid factor of multiple sites without organ or systems involvement: Secondary | ICD-10-CM | POA: Diagnosis not present

## 2017-01-31 DIAGNOSIS — M2550 Pain in unspecified joint: Secondary | ICD-10-CM | POA: Diagnosis not present

## 2017-01-31 DIAGNOSIS — Z6834 Body mass index (BMI) 34.0-34.9, adult: Secondary | ICD-10-CM | POA: Diagnosis not present

## 2017-01-31 DIAGNOSIS — M797 Fibromyalgia: Secondary | ICD-10-CM | POA: Diagnosis not present

## 2017-01-31 DIAGNOSIS — M545 Low back pain: Secondary | ICD-10-CM | POA: Diagnosis not present

## 2017-02-17 DIAGNOSIS — N3000 Acute cystitis without hematuria: Secondary | ICD-10-CM | POA: Diagnosis not present

## 2017-02-17 DIAGNOSIS — R300 Dysuria: Secondary | ICD-10-CM | POA: Diagnosis not present

## 2017-02-26 DIAGNOSIS — L905 Scar conditions and fibrosis of skin: Secondary | ICD-10-CM | POA: Diagnosis not present

## 2017-02-26 DIAGNOSIS — L2089 Other atopic dermatitis: Secondary | ICD-10-CM | POA: Diagnosis not present

## 2017-03-20 DIAGNOSIS — M797 Fibromyalgia: Secondary | ICD-10-CM | POA: Diagnosis not present

## 2017-03-20 DIAGNOSIS — E669 Obesity, unspecified: Secondary | ICD-10-CM | POA: Diagnosis not present

## 2017-03-20 DIAGNOSIS — Z23 Encounter for immunization: Secondary | ICD-10-CM | POA: Diagnosis not present

## 2017-03-20 DIAGNOSIS — Z79899 Other long term (current) drug therapy: Secondary | ICD-10-CM | POA: Diagnosis not present

## 2017-03-20 DIAGNOSIS — M79645 Pain in left finger(s): Secondary | ICD-10-CM | POA: Diagnosis not present

## 2017-03-20 DIAGNOSIS — M069 Rheumatoid arthritis, unspecified: Secondary | ICD-10-CM | POA: Diagnosis not present

## 2017-03-20 DIAGNOSIS — Z86010 Personal history of colonic polyps: Secondary | ICD-10-CM | POA: Diagnosis not present

## 2017-03-20 DIAGNOSIS — Z6832 Body mass index (BMI) 32.0-32.9, adult: Secondary | ICD-10-CM | POA: Diagnosis not present

## 2017-03-20 DIAGNOSIS — F338 Other recurrent depressive disorders: Secondary | ICD-10-CM | POA: Diagnosis not present

## 2017-03-20 DIAGNOSIS — E039 Hypothyroidism, unspecified: Secondary | ICD-10-CM | POA: Diagnosis not present

## 2017-03-28 DIAGNOSIS — M47816 Spondylosis without myelopathy or radiculopathy, lumbar region: Secondary | ICD-10-CM | POA: Diagnosis not present

## 2017-03-29 DIAGNOSIS — M79644 Pain in right finger(s): Secondary | ICD-10-CM | POA: Diagnosis not present

## 2017-03-29 DIAGNOSIS — S62616A Displaced fracture of proximal phalanx of right little finger, initial encounter for closed fracture: Secondary | ICD-10-CM | POA: Diagnosis not present

## 2017-04-17 DIAGNOSIS — S62646D Nondisplaced fracture of proximal phalanx of right little finger, subsequent encounter for fracture with routine healing: Secondary | ICD-10-CM | POA: Diagnosis not present

## 2017-05-21 ENCOUNTER — Other Ambulatory Visit (HOSPITAL_COMMUNITY): Payer: Self-pay | Admitting: Orthopedic Surgery

## 2017-05-21 DIAGNOSIS — M25561 Pain in right knee: Secondary | ICD-10-CM | POA: Diagnosis not present

## 2017-05-21 DIAGNOSIS — T84032A Mechanical loosening of internal right knee prosthetic joint, initial encounter: Secondary | ICD-10-CM

## 2017-05-30 ENCOUNTER — Encounter (HOSPITAL_COMMUNITY): Payer: BLUE CROSS/BLUE SHIELD

## 2017-06-05 ENCOUNTER — Encounter (HOSPITAL_COMMUNITY)
Admission: RE | Admit: 2017-06-05 | Discharge: 2017-06-05 | Disposition: A | Discharge status: Home / Self Care | Payer: Medicare Other | Source: Ambulatory Visit | Attending: Orthopedic Surgery | Admitting: Orthopedic Surgery

## 2017-06-05 DIAGNOSIS — T84032A Mechanical loosening of internal right knee prosthetic joint, initial encounter: Secondary | ICD-10-CM | POA: Diagnosis not present

## 2017-06-05 DIAGNOSIS — R948 Abnormal results of function studies of other organs and systems: Secondary | ICD-10-CM | POA: Diagnosis not present

## 2017-06-05 MED ORDER — TECHNETIUM TC 99M MEDRONATE IV KIT
21.0000 | PACK | Freq: Once | INTRAVENOUS | Status: AC | PRN
  Administered 2017-06-05: 21 via INTRAVENOUS

## 2017-06-19 DIAGNOSIS — M0579 Rheumatoid arthritis with rheumatoid factor of multiple sites without organ or systems involvement: Secondary | ICD-10-CM | POA: Diagnosis not present

## 2017-06-25 DIAGNOSIS — M25561 Pain in right knee: Secondary | ICD-10-CM | POA: Diagnosis not present

## 2017-06-27 ENCOUNTER — Other Ambulatory Visit: Payer: Self-pay | Admitting: Orthopedic Surgery

## 2017-07-10 DIAGNOSIS — M25561 Pain in right knee: Secondary | ICD-10-CM | POA: Diagnosis not present

## 2017-07-22 ENCOUNTER — Encounter (HOSPITAL_COMMUNITY)
Admission: RE | Admit: 2017-07-22 | Discharge: 2017-07-22 | Disposition: A | Discharge status: Home / Self Care | Payer: Medicare Other | Source: Ambulatory Visit | Attending: Orthopedic Surgery | Admitting: Orthopedic Surgery

## 2017-07-22 ENCOUNTER — Ambulatory Visit (HOSPITAL_COMMUNITY)
Admission: RE | Admit: 2017-07-22 | Discharge: 2017-07-22 | Disposition: A | Discharge status: Home / Self Care | Payer: Medicare Other | Source: Ambulatory Visit | Attending: Orthopedic Surgery | Admitting: Orthopedic Surgery

## 2017-07-22 ENCOUNTER — Encounter (HOSPITAL_COMMUNITY): Payer: Self-pay

## 2017-07-22 ENCOUNTER — Other Ambulatory Visit: Payer: Self-pay

## 2017-07-22 DIAGNOSIS — R9431 Abnormal electrocardiogram [ECG] [EKG]: Secondary | ICD-10-CM | POA: Insufficient documentation

## 2017-07-22 DIAGNOSIS — Z01818 Encounter for other preprocedural examination: Secondary | ICD-10-CM | POA: Insufficient documentation

## 2017-07-22 DIAGNOSIS — R918 Other nonspecific abnormal finding of lung field: Secondary | ICD-10-CM | POA: Insufficient documentation

## 2017-07-22 DIAGNOSIS — J984 Other disorders of lung: Secondary | ICD-10-CM | POA: Diagnosis not present

## 2017-07-22 DIAGNOSIS — Z01812 Encounter for preprocedural laboratory examination: Secondary | ICD-10-CM | POA: Insufficient documentation

## 2017-07-22 DIAGNOSIS — Z0183 Encounter for blood typing: Secondary | ICD-10-CM | POA: Diagnosis not present

## 2017-07-22 LAB — URINALYSIS, ROUTINE W REFLEX MICROSCOPIC
Bilirubin Urine: NEGATIVE
Glucose, UA: NEGATIVE mg/dL
Ketones, ur: NEGATIVE mg/dL
Nitrite: NEGATIVE
PROTEIN: NEGATIVE mg/dL
Specific Gravity, Urine: 1.016 (ref 1.005–1.030)
pH: 5 (ref 5.0–8.0)

## 2017-07-22 LAB — CBC WITH DIFFERENTIAL/PLATELET
ABS IMMATURE GRANULOCYTES: 0 10*3/uL (ref 0.0–0.1)
BASOS ABS: 0.1 10*3/uL (ref 0.0–0.1)
Basophils Relative: 1 %
Eosinophils Absolute: 0.2 10*3/uL (ref 0.0–0.7)
Eosinophils Relative: 3 %
HCT: 46.3 % — ABNORMAL HIGH (ref 36.0–46.0)
Hemoglobin: 15.2 g/dL — ABNORMAL HIGH (ref 12.0–15.0)
IMMATURE GRANULOCYTES: 0 %
LYMPHS PCT: 37 %
Lymphs Abs: 3.4 10*3/uL (ref 0.7–4.0)
MCH: 30.6 pg (ref 26.0–34.0)
MCHC: 32.8 g/dL (ref 30.0–36.0)
MCV: 93.3 fL (ref 78.0–100.0)
MONOS PCT: 8 %
Monocytes Absolute: 0.8 10*3/uL (ref 0.1–1.0)
NEUTROS ABS: 4.8 10*3/uL (ref 1.7–7.7)
NEUTROS PCT: 51 %
PLATELETS: 358 10*3/uL (ref 150–400)
RBC: 4.96 MIL/uL (ref 3.87–5.11)
RDW: 14.7 % (ref 11.5–15.5)
WBC: 9.4 10*3/uL (ref 4.0–10.5)

## 2017-07-22 LAB — BASIC METABOLIC PANEL
Anion gap: 9 (ref 5–15)
BUN: 22 mg/dL — ABNORMAL HIGH (ref 6–20)
CALCIUM: 9 mg/dL (ref 8.9–10.3)
CO2: 26 mmol/L (ref 22–32)
Chloride: 100 mmol/L — ABNORMAL LOW (ref 101–111)
Creatinine, Ser: 0.82 mg/dL (ref 0.44–1.00)
GLUCOSE: 89 mg/dL (ref 65–99)
Potassium: 4.4 mmol/L (ref 3.5–5.1)
SODIUM: 135 mmol/L (ref 135–145)

## 2017-07-22 LAB — APTT: APTT: 41 s — AB (ref 24–36)

## 2017-07-22 LAB — TYPE AND SCREEN
ABO/RH(D): O POS
ANTIBODY SCREEN: NEGATIVE

## 2017-07-22 LAB — SURGICAL PCR SCREEN
MRSA, PCR: NEGATIVE
STAPHYLOCOCCUS AUREUS: POSITIVE — AB

## 2017-07-22 LAB — PROTIME-INR
INR: 1.02
PROTHROMBIN TIME: 13.3 s (ref 11.4–15.2)

## 2017-07-22 NOTE — Progress Notes (Signed)
I called a prescription for Mupiro[facility] ointment to [facility] [employer] [employer]

## 2017-07-22 NOTE — Pre-Procedure Instructions (Signed)
Lisa Duke  07/22/2017      [id number] - [place], [place] - [address] AT [place] [address] Phone: [phone] Fax: [phone]    Your procedure is scheduled on Friday, June 21st   Report to [facility] Admitting at  5:30AM             (posted surgery time 7:30a - 9:50a)   Call this number if you have problems the morning of surgery:  [phone]   Remember:  Nothing to eat nor drink after midnight Thursday, with the exception of your meds.  Take these medicines the morning of surgery with A SIP OF WATER : Clonazepam, Duloxetine, Hydrocodone, Synthroid, Tolterodine.  Please use your inhaler that morning.    Do not wear jewelry, make-up or nail polish.  Do not wear lotions, powders, or perfumes, or deodorant.  Do not shave 48 hours prior to surgery.  Men may shave face and neck.  Do not bring valuables to the hospital.  [facility] is not responsible for any belongings or valuables.  Contacts, dentures or bridgework may not be worn into surgery.  Leave your suitcase in the car.  After surgery it may be brought to your room.  For patients admitted to the hospital, discharge time will be determined by your treatment team.  Please read over the following fact sheets that you were given. Pain Booklet, MRSA Information and Surgical Site Infection Prevention      [place]- Preparing For Surgery  Before surgery, you can play an important role. Because skin is not sterile, your skin needs to be as free of germs as possible. You can reduce the number of germs on your skin by washing with CHG (chlorahexidine gluconate) Soap before surgery.  CHG is an antiseptic cleaner which kills germs and bonds with the skin to continue killing germs even after washing.    Oral Hygiene is also important to reduce your risk of infection.  Remember - BRUSH YOUR TEETH THE MORNING OF SURGERY WITH YOUR REGULAR  TOOTHPASTE  Please do not use if you have an allergy to CHG or antibacterial soaps. If your skin becomes reddened/irritated stop using the CHG.  Do not shave (including legs and underarms) for at least 48 hours prior to first CHG shower. It is OK to shave your face.  Please follow these instructions carefully.   1. Shower the NIGHT BEFORE SURGERY and the MORNING OF SURGERY with CHG.   2. If you chose to wash your hair, wash your hair first as usual with your normal shampoo.  3. After you shampoo, rinse your hair and body thoroughly to remove the shampoo.  4. Use CHG as you would any other liquid soap. You can apply CHG directly to the skin and wash gently with a scrungie or a clean washcloth.   5. Apply the CHG Soap to your body ONLY FROM THE NECK DOWN.  Do not use on open wounds or open sores. Avoid contact with your eyes, ears, mouth and genitals (private parts). Wash Face and genitals (private parts)  with your normal soap.  6. Wash thoroughly, paying special attention to the area where your surgery will be performed.  7. Thoroughly rinse your body with warm water from the neck down.  8. DO NOT shower/wash with your normal soap after using and rinsing off the CHG Soap.  9. Pat yourself dry with a CLEAN TOWEL.  10. Wear CLEAN PAJAMAS to bed the night before surgery, wear comfortable clothes the morning of surgery  11. Place CLEAN SHEETS on your bed the night of your first shower and DO NOT SLEEP WITH PETS.    Day of Surgery:  Do not apply any deodorants/lotions.  Please wear clean clothes to the hospital/surgery center.   Remember to brush your teeth WITH YOUR REGULAR TOOTHPASTE.

## 2017-07-24 ENCOUNTER — Encounter (HOSPITAL_COMMUNITY): Payer: Self-pay

## 2017-07-24 NOTE — Progress Notes (Signed)
Anesthesia Chart Review:   Case:  [id number] Date/Time:  08/02/17 0715   Procedure:  TOTAL KNEE REVISION (Right )   Anesthesia type:  Spinal   Pre-op diagnosis:  LOOSE RIGHT TOTAL KNEE ARTHROPLASTY   Location:  [place] OR ROOM 18 / [facility]   Surgeon:  Frederik Pear, MD      DISCUSSION: - Pt is a 72 year old female with hx asthma, RA.   - Saw cardiologist Tollie Eth, MD in the fall of 2015 for abnormal EKG.  Echo ordered, results below.  Prn cardiology f/u recommended.   - EKG remains abnormal, appears stable.  No CV symptoms documented at pre-admission testing   VS: BP (!) 98/58   Pulse 79   Temp 36.7 C   Resp 20   Ht 5' 5.5" (1.664 m)   SpO2 95%   BMI 30.81 kg/m    PROVIDERS: PCP is Hulan Fess, MD    LABS:  - PT normal, PTT 41. Will repeat PTT day of surgery - I notified Juliann Pulse in Dr. Damita Dunnings office of UA results.   (all labs ordered are listed, but only abnormal results are displayed)  Labs Reviewed  SURGICAL PCR SCREEN - Abnormal; Notable for the following components:      Result Value   Staphylococcus aureus POSITIVE (*)    All other components within normal limits  APTT - Abnormal; Notable for the following components:   aPTT 41 (*)    All other components within normal limits  BASIC METABOLIC PANEL - Abnormal; Notable for the following components:   Chloride 100 (*)    BUN 22 (*)    All other components within normal limits  CBC WITH DIFFERENTIAL/PLATELET - Abnormal; Notable for the following components:   Hemoglobin 15.2 (*)    HCT 46.3 (*)    All other components within normal limits  URINALYSIS, ROUTINE W REFLEX MICROSCOPIC - Abnormal; Notable for the following components:   Hgb urine dipstick SMALL (*)    Leukocytes, UA MODERATE (*)    Bacteria, UA RARE (*)    All other components within normal limits  PROTIME-INR  TYPE AND SCREEN    IMAGES:  CXR 07/22/17:  - Nodular and linear airspace densities in the left upper lobe. This could reflect  pneumonia or scarring. This could be further evaluated with chest CT. - Moderate compression fracture in the lower thoracic spine, age indeterminate but new since 2015.  - I spoke with Randall Hiss, [place] in Dr. Damita Dunnings office about abnormal CXR results.  He recently evaluated pt who showed no signs of illness.  If warranted, they will get a CT chest for pt while inpatient.     EKG 07/22/17: NSR. Inferior infarct, age undetermined. Anteroseptal infarct, age undetermined - appears stable when compared to EKG 11/12/13   CV:  Echo 11/23/2013 (Dr. Thurman Coyer office): 1.  Mild concentric LVH with normal LV systolic function.  EF 60%. 2.  LA cavity mildly dilated. 3.  Mild mitral regurgitation. 4.  Trace tricuspid regurgitation. 5.  Trace pulmonic regurgitation.   Past Medical History:  Diagnosis Date  . Anxiety   . Arthritis 2010   Rhematoid  . Asthma    doesnt use inhalers often  . Back ache    arthritis  . Depression   . Dysrhythmia    "has cardiac arrhythmia" no cardiologist  . Fibromyalgia   . GERD (gastroesophageal reflux disease)   . Headache(784.0)    Migraines  . Hypothyroidism   . Incontinence  of urine     Past Surgical History:  Procedure Laterality Date  . BACK SURGERY  2006   released nerves form being compressed  . FRACTURE SURGERY     rt foot x2  . JOINT REPLACEMENT     bilateral knee replacement  . [place] SURGERY  2006  . TOTAL KNEE ARTHROPLASTY     Bilateral , 2004 and 2009  . VAGINAL HYSTERECTOMY  1996   partial    MEDICATIONS: . acebutolol (SECTRAL) 400 MG capsule  . aspirin EC 81 MG tablet  . betamethasone dipropionate (DIPROLENE) 0.05 % cream  . budesonide (PULMICORT) 180 MCG/ACT inhaler  . clonazePAM (KLONOPIN) 2 MG tablet  . DULoxetine (CYMBALTA) 60 MG capsule  . folic acid (FOLVITE) 1 MG tablet  . HYDROcodone-acetaminophen (NORCO) 10-325 MG tablet  . inFLIXimab (REMICADE IV)  . Methotrexate Sodium (METHOTREXATE, PF,) 50 MG/2ML injection  .  nortriptyline (PAMELOR) 50 MG capsule  . Polyethyl Glycol-Propyl Glycol (SYSTANE) 0.4-0.3 % SOLN  . sodium fluoride (DENTAGEL) 1.1 % GEL dental gel  . SYNTHROID 112 MCG tablet  . tolterodine (DETROL LA) 4 MG 24 hr capsule  . traMADol (ULTRAM) 50 MG tablet   No current facility-administered medications for this encounter.     If labs acceptable day of surgery, I anticipate pt can proceed with surgery as scheduled.  Willeen Cass, FNP-BC [facility] Short Stay Surgical Center/Anesthesiology Phone: [phone] 07/26/2017 3:32 PM

## 2017-07-31 DIAGNOSIS — T84032A Mechanical loosening of internal right knee prosthetic joint, initial encounter: Secondary | ICD-10-CM | POA: Diagnosis present

## 2017-07-31 NOTE — H&P (Signed)
TOTAL KNEE REVISION ADMISSION H&P  Patient is being admitted for right revision total knee arthroplasty.  Subjective:  Chief Complaint:right knee pain.  HPI: Lisa Duke, 72 y.o. female, has a history of pain and functional disability in the right knee(s) due to failed previous arthroplasty and patient has failed non-surgical conservative treatments for greater than 12 weeks to include NSAID's and/or analgesics, flexibility and strengthening excercises, use of assistive devices, weight reduction as appropriate and activity modification. The indications for the revision of the total knee arthroplasty are loosening of one or more components. Onset of symptoms was gradual starting 9 years ago with gradually worsening course since that time.  Prior procedures on the right knee(s) include arthroplasty.  Patient currently rates pain in the right knee(s) at 10 out of 10 with activity. There is night pain, worsening of pain with activity and weight bearing, pain that interferes with activities of daily living and pain with passive range of motion.  Patient has evidence of prosthetic loosening by imaging studies. This condition presents safety issues increasing the risk of falls.  There is no current active infection.  Patient Active Problem List   Diagnosis Date Noted  . Lumbar stenosis with neurogenic claudication 12/30/2013   Past Medical History:  Diagnosis Date  . Anxiety   . Arthritis 2010   Rhematoid  . Asthma    doesnt use inhalers often  . Back ache    arthritis  . Depression   . Fibromyalgia   . GERD (gastroesophageal reflux disease)   . Headache(784.0)    Migraines  . Hypothyroidism   . Incontinence of urine     Past Surgical History:  Procedure Laterality Date  . BACK SURGERY  2006   released nerves form being compressed  . FRACTURE SURGERY     rt foot x2  . JOINT REPLACEMENT     bilateral knee replacement  . [place] SURGERY  2006  . TOTAL KNEE ARTHROPLASTY     Bilateral , 2004 and 2009  . VAGINAL HYSTERECTOMY  1996   partial    No current facility-administered medications for this encounter.    Current Outpatient Medications  Medication Sig Dispense Refill Last Dose  . acebutolol (SECTRAL) 400 MG capsule Take 400 mg by mouth at bedtime.     Marland Kitchen aspirin EC 81 MG tablet Take 81 mg by mouth daily.     . betamethasone dipropionate (DIPROLENE) 0.05 % cream Apply 1 application topically 2 (two) times daily as needed. For skin irritation/rash  0   . budesonide (PULMICORT) 180 MCG/ACT inhaler Inhale 2 puffs into the lungs as needed (asthma).      . clonazePAM (KLONOPIN) 2 MG tablet Take 2 mg by mouth 3 (three) times daily.      . DULoxetine (CYMBALTA) 60 MG capsule Take 60 mg by mouth 2 (two) times daily.      . folic acid (FOLVITE) 1 MG tablet Take 3 mg by mouth daily.      Marland Kitchen HYDROcodone-acetaminophen (NORCO) 10-325 MG tablet Take 1 tablet by mouth every 6 (six) hours as needed (FOR SEVERE BACK ACHE).     . Methotrexate Sodium (METHOTREXATE, PF,) 50 MG/2ML injection Inject 25 mg into the muscle every Sunday.  0   . nortriptyline (PAMELOR) 50 MG capsule Take 50 mg by mouth at bedtime.     Vladimir Faster Glycol-Propyl Glycol (SYSTANE) 0.4-0.3 % SOLN Place 1 drop into both eyes 3 (three) times daily as needed (for dry/irritated eyes.).     Marland Kitchen  sodium fluoride (DENTAGEL) 1.1 % GEL dental gel Place 1 application onto teeth at bedtime.     Marland Kitchen SYNTHROID 112 MCG tablet Take 112 mcg by mouth daily before breakfast.     . tolterodine (DETROL LA) 4 MG 24 hr capsule Take 4 mg by mouth every morning.     . traMADol (ULTRAM) 50 MG tablet Take 50 mg by mouth every 6 (six) hours as needed for pain.  0   . inFLIXimab (REMICADE IV) Inject 1 Dose into the vein every 6 (six) weeks.      Allergies  Allergen Reactions  . Sulfa Antibiotics Itching    Social History   Tobacco Use  . Smoking status: Former Smoker    Types: Cigarettes    Last attempt to quit: 05/06/1980    Years  since quitting: 37.2  . Smokeless tobacco: Never Used  Substance Use Topics  . Alcohol use: No    No family history on file.    Review of Systems  Constitutional: Positive for malaise/fatigue.  HENT: Positive for sinus pain.   Eyes: Negative.   Respiratory: Negative.   Cardiovascular: Negative.   Gastrointestinal: Positive for heartburn.  Genitourinary:       Poor bladder control  Musculoskeletal: Positive for joint pain and myalgias.  Skin: Negative.   Neurological: Negative.   Endo/Heme/Allergies: Bruises/bleeds easily.  Psychiatric/Behavioral: Negative.      Objective:  Physical Exam  Constitutional: She is oriented to person, place, and time. She appears well-developed and well-nourished.  HENT:  Head: Normocephalic and atraumatic.  Eyes: Pupils are equal, round, and reactive to light.  Neck: Normal range of motion. Neck supple.  Cardiovascular: Intact distal pulses.  Respiratory: Effort normal.  Musculoskeletal: She exhibits tenderness.  She has diffuse tenderness over the right knee.  Increased pain with going from sitting to standing.  She continues to have 2+ ligamentous laxity but with a good endpoint to valgus stress.  No noticeable effusion.  No erythema or warmth.  Her calves are soft and nontender.    Neurological: She is alert and oriented to person, place, and time.  Skin: Skin is warm and dry.  Psychiatric: She has a normal mood and affect. Her behavior is normal. Judgment and thought content normal.    Vital signs in last 24 hours:    Labs:  Estimated body mass index is 30.81 kg/m as calculated from the following:   Height as of 07/22/17: 5' 5.5" (1.664 m).   Weight as of 12/30/13: 85.3 kg (188 lb).  Imaging Review Plain radiographs demonstrate the femoral component has migrated proximally, laterally and is definitely loose.  Preoperative templating of the joint replacement has been completed, documented, and submitted to the Operating Room  personnel in order to optimize intra-operative equipment management.   Assessment/Plan:  End stage arthritis, right knee(s) with failed previous arthroplasty.   The patient history, physical examination, clinical judgment of the provider and imaging studies are consistent with end stage degenerative joint disease of the right knee(s), previous total knee arthroplasty. Revision total knee arthroplasty is deemed medically necessary. The treatment options including medical management, injection therapy, arthroscopy and revision arthroplasty were discussed at length. The risks and benefits of revision total knee arthroplasty were presented and reviewed. The risks due to aseptic loosening, infection, stiffness, patella tracking problems, thromboembolic complications and other imponderables were discussed. The patient acknowledged the explanation, agreed to proceed with the plan and consent was signed. Patient is being admitted for inpatient treatment for surgery, pain  control, PT, OT, prophylactic antibiotics, VTE prophylaxis, progressive ambulation and ADL's and discharge planning.The patient is planning to be discharged home with home health services.

## 2017-08-01 ENCOUNTER — Encounter (HOSPITAL_COMMUNITY): Payer: Self-pay | Admitting: Anesthesiology

## 2017-08-01 MED ORDER — TRANEXAMIC ACID 1000 MG/10ML IV SOLN
2000.0000 mg | INTRAVENOUS | Status: DC
  Filled 2017-08-01: qty 20

## 2017-08-01 MED ORDER — BUPIVACAINE LIPOSOME 1.3 % IJ SUSP
20.0000 mL | Freq: Once | INTRAMUSCULAR | Status: DC
  Filled 2017-08-01: qty 20

## 2017-08-01 MED ORDER — TRANEXAMIC ACID 1000 MG/10ML IV SOLN
1000.0000 mg | INTRAVENOUS | Status: AC
  Administered 2017-08-02: 1000 mg via INTRAVENOUS
  Filled 2017-08-01: qty 1100

## 2017-08-01 NOTE — Anesthesia Preprocedure Evaluation (Addendum)
Anesthesia Evaluation  Patient identified by MRN, date of birth, ID band Patient awake    Reviewed: Allergy & Precautions, NPO status , Patient's Chart, lab work & pertinent test results, reviewed documented beta blocker date and time   Airway Mallampati: II  TM Distance: >3 FB Neck ROM: Full    Dental no notable dental hx. (+) Teeth Intact   Pulmonary asthma , former smoker,    Pulmonary exam normal breath sounds clear to auscultation       Cardiovascular hypertension, Pt. on medications and Pt. on home beta blockers Normal cardiovascular exam Rhythm:Regular Rate:Normal  EKG- NSR with inferior and anteroseptal MI   Neuro/Psych  Headaches, PSYCHIATRIC DISORDERS Anxiety Depression  Neuromuscular disease    GI/Hepatic Neg liver ROS, GERD  Medicated and Controlled,  Endo/Other  Hypothyroidism Obesity  Renal/GU negative Renal ROS Bladder dysfunction  Incontinence of urin\e    Musculoskeletal  (+) Arthritis , Rheumatoid disorders,  Fibromyalgia -Loose right knee total arthroplasty Hx/o previous lumbar surgery Neurogenic claudication   Abdominal (+) + obese,   Peds  Hematology negative hematology ROS (+)   Anesthesia Other Findings   Reproductive/Obstetrics                           Anesthesia Physical Anesthesia Plan  ASA: III  Anesthesia Plan: Spinal   Post-op Pain Management:  Regional for Post-op pain   Induction: Intravenous  PONV Risk Score and Plan: 3 and Ondansetron, Propofol infusion and Treatment may vary due to age or medical condition  Airway Management Planned: Natural Airway and Simple Face Mask  Additional Equipment:   Intra-op Plan:   Post-operative Plan:   Informed Consent: I have reviewed the patients History and Physical, chart, labs and discussed the procedure including the risks, benefits and alternatives for the proposed anesthesia with the patient or authorized  representative who has indicated his/her understanding and acceptance.   Dental advisory given  Plan Discussed with: CRNA and Surgeon  Anesthesia Plan Comments:        Anesthesia Quick Evaluation

## 2017-08-02 ENCOUNTER — Inpatient Hospital Stay (HOSPITAL_COMMUNITY): Payer: Medicare Other | Admitting: Emergency Medicine

## 2017-08-02 ENCOUNTER — Inpatient Hospital Stay (HOSPITAL_COMMUNITY)
Admission: RE | Admit: 2017-08-02 | Discharge: 2017-08-05 | DRG: 467 | Disposition: A | Discharge status: Home / Self Care | Payer: Medicare Other | Source: Ambulatory Visit | Attending: Orthopedic Surgery | Admitting: Orthopedic Surgery

## 2017-08-02 ENCOUNTER — Encounter (HOSPITAL_COMMUNITY)
Admission: RE | Disposition: A | Discharge status: Home / Self Care | Payer: Self-pay | Source: Ambulatory Visit | Attending: Orthopedic Surgery

## 2017-08-02 ENCOUNTER — Encounter (HOSPITAL_COMMUNITY): Payer: Self-pay | Admitting: *Deleted

## 2017-08-02 ENCOUNTER — Inpatient Hospital Stay (HOSPITAL_COMMUNITY): Payer: Medicare Other

## 2017-08-02 DIAGNOSIS — E039 Hypothyroidism, unspecified: Secondary | ICD-10-CM | POA: Diagnosis present

## 2017-08-02 DIAGNOSIS — Z96653 Presence of artificial knee joint, bilateral: Secondary | ICD-10-CM | POA: Diagnosis present

## 2017-08-02 DIAGNOSIS — M25369 Other instability, unspecified knee: Secondary | ICD-10-CM | POA: Diagnosis present

## 2017-08-02 DIAGNOSIS — Z87891 Personal history of nicotine dependence: Secondary | ICD-10-CM

## 2017-08-02 DIAGNOSIS — Z96659 Presence of unspecified artificial knee joint: Secondary | ICD-10-CM

## 2017-08-02 DIAGNOSIS — Z90710 Acquired absence of both cervix and uterus: Secondary | ICD-10-CM | POA: Diagnosis not present

## 2017-08-02 DIAGNOSIS — M797 Fibromyalgia: Secondary | ICD-10-CM | POA: Diagnosis present

## 2017-08-02 DIAGNOSIS — J45909 Unspecified asthma, uncomplicated: Secondary | ICD-10-CM | POA: Diagnosis present

## 2017-08-02 DIAGNOSIS — Z882 Allergy status to sulfonamides status: Secondary | ICD-10-CM

## 2017-08-02 DIAGNOSIS — K219 Gastro-esophageal reflux disease without esophagitis: Secondary | ICD-10-CM | POA: Diagnosis present

## 2017-08-02 DIAGNOSIS — Z96651 Presence of right artificial knee joint: Secondary | ICD-10-CM | POA: Diagnosis not present

## 2017-08-02 DIAGNOSIS — Z79890 Hormone replacement therapy (postmenopausal): Secondary | ICD-10-CM

## 2017-08-02 DIAGNOSIS — Z7982 Long term (current) use of aspirin: Secondary | ICD-10-CM

## 2017-08-02 DIAGNOSIS — F329 Major depressive disorder, single episode, unspecified: Secondary | ICD-10-CM | POA: Diagnosis present

## 2017-08-02 DIAGNOSIS — D62 Acute posthemorrhagic anemia: Secondary | ICD-10-CM | POA: Diagnosis not present

## 2017-08-02 DIAGNOSIS — M069 Rheumatoid arthritis, unspecified: Secondary | ICD-10-CM | POA: Diagnosis present

## 2017-08-02 DIAGNOSIS — Y792 Prosthetic and other implants, materials and accessory orthopedic devices associated with adverse incidents: Secondary | ICD-10-CM | POA: Diagnosis present

## 2017-08-02 DIAGNOSIS — F419 Anxiety disorder, unspecified: Secondary | ICD-10-CM | POA: Diagnosis present

## 2017-08-02 DIAGNOSIS — G8918 Other acute postprocedural pain: Secondary | ICD-10-CM | POA: Diagnosis not present

## 2017-08-02 DIAGNOSIS — R339 Retention of urine, unspecified: Secondary | ICD-10-CM | POA: Diagnosis present

## 2017-08-02 DIAGNOSIS — M1711 Unilateral primary osteoarthritis, right knee: Secondary | ICD-10-CM | POA: Diagnosis present

## 2017-08-02 DIAGNOSIS — T84032A Mechanical loosening of internal right knee prosthetic joint, initial encounter: Secondary | ICD-10-CM | POA: Diagnosis present

## 2017-08-02 DIAGNOSIS — Z471 Aftercare following joint replacement surgery: Secondary | ICD-10-CM | POA: Diagnosis not present

## 2017-08-02 DIAGNOSIS — I10 Essential (primary) hypertension: Secondary | ICD-10-CM | POA: Diagnosis present

## 2017-08-02 HISTORY — PX: TOTAL KNEE REVISION: SHX996

## 2017-08-02 LAB — APTT: APTT: 40 s — AB (ref 24–36)

## 2017-08-02 SURGERY — TOTAL KNEE REVISION
Anesthesia: Spinal | Site: Knee | Laterality: Right

## 2017-08-02 MED ORDER — ROPIVACAINE HCL 7.5 MG/ML IJ SOLN
INTRAMUSCULAR | Status: DC | PRN
  Administered 2017-08-02: 20 mL via PERINEURAL

## 2017-08-02 MED ORDER — PANTOPRAZOLE SODIUM 40 MG PO TBEC
40.0000 mg | DELAYED_RELEASE_TABLET | Freq: Every day | ORAL | Status: DC
  Administered 2017-08-02 – 2017-08-05 (×4): 40 mg via ORAL
  Filled 2017-08-02 (×4): qty 1

## 2017-08-02 MED ORDER — FLEET ENEMA 7-19 GM/118ML RE ENEM
1.0000 | ENEMA | Freq: Once | RECTAL | Status: DC | PRN
Start: 2017-08-02 — End: 2017-08-05

## 2017-08-02 MED ORDER — ZOLPIDEM TARTRATE 5 MG PO TABS
5.0000 mg | ORAL_TABLET | Freq: Every evening | ORAL | Status: DC | PRN
  Administered 2017-08-03: 5 mg via ORAL
  Filled 2017-08-02: qty 1

## 2017-08-02 MED ORDER — ONDANSETRON HCL 4 MG/2ML IJ SOLN: 4.0000 mg | Freq: Four times a day (QID) | INTRAMUSCULAR | Status: DC | PRN

## 2017-08-02 MED ORDER — HYDROMORPHONE HCL 2 MG/ML IJ SOLN: 0.5000 mg | INTRAMUSCULAR | Status: DC | PRN

## 2017-08-02 MED ORDER — CEFUROXIME SODIUM 1.5 G IV SOLR
INTRAVENOUS | Status: DC | PRN
  Administered 2017-08-02: 1.5 g via INTRAVENOUS

## 2017-08-02 MED ORDER — TIZANIDINE HCL 2 MG PO TABS: 2.0000 mg | ORAL_TABLET | Freq: Four times a day (QID) | ORAL | 0 refills | Status: AC | PRN

## 2017-08-02 MED ORDER — LIDOCAINE 2% (20 MG/ML) 5 ML SYRINGE
INTRAMUSCULAR | Status: DC | PRN
  Administered 2017-08-02: 40 mg via INTRAVENOUS

## 2017-08-02 MED ORDER — ACETAMINOPHEN 325 MG PO TABS
325.0000 mg | ORAL_TABLET | Freq: Four times a day (QID) | ORAL | Status: DC | PRN
  Administered 2017-08-03 – 2017-08-05 (×4): 650 mg via ORAL
  Filled 2017-08-02 (×4): qty 2

## 2017-08-02 MED ORDER — BUPIVACAINE-EPINEPHRINE 0.25% -1:200000 IJ SOLN
INTRAMUSCULAR | Status: AC
  Filled 2017-08-02: qty 1

## 2017-08-02 MED ORDER — NORTRIPTYLINE HCL 25 MG PO CAPS
50.0000 mg | ORAL_CAPSULE | Freq: Every day | ORAL | Status: DC
  Administered 2017-08-02 – 2017-08-04 (×3): 50 mg via ORAL
  Filled 2017-08-02 (×4): qty 2

## 2017-08-02 MED ORDER — PROPOFOL 10 MG/ML IV BOLUS
INTRAVENOUS | Status: DC | PRN
  Administered 2017-08-02: 10 mg via INTRAVENOUS

## 2017-08-02 MED ORDER — GABAPENTIN 300 MG PO CAPS
300.0000 mg | ORAL_CAPSULE | Freq: Three times a day (TID) | ORAL | Status: DC
  Administered 2017-08-02 – 2017-08-05 (×10): 300 mg via ORAL
  Filled 2017-08-02 (×10): qty 1

## 2017-08-02 MED ORDER — TRANEXAMIC ACID 1000 MG/10ML IV SOLN
1000.0000 mg | Freq: Once | INTRAVENOUS | Status: AC
  Administered 2017-08-02: 1000 mg via INTRAVENOUS
  Filled 2017-08-02: qty 10

## 2017-08-02 MED ORDER — BUPIVACAINE-EPINEPHRINE (PF) 0.5% -1:200000 IJ SOLN
INTRAMUSCULAR | Status: DC | PRN
  Administered 2017-08-02: 50 mL

## 2017-08-02 MED ORDER — POLYVINYL ALCOHOL 1.4 % OP SOLN: 1.0000 [drp] | Freq: Three times a day (TID) | OPHTHALMIC | Status: DC | PRN

## 2017-08-02 MED ORDER — DIPHENHYDRAMINE HCL 12.5 MG/5ML PO ELIX: 12.5000 mg | ORAL_SOLUTION | ORAL | Status: DC | PRN

## 2017-08-02 MED ORDER — OXYCODONE-ACETAMINOPHEN 5-325 MG PO TABS: 1.0000 | ORAL_TABLET | ORAL | 0 refills | Status: AC | PRN

## 2017-08-02 MED ORDER — ACEBUTOLOL HCL 400 MG PO CAPS
400.0000 mg | ORAL_CAPSULE | Freq: Every day | ORAL | Status: DC
  Administered 2017-08-03 – 2017-08-04 (×2): 400 mg via ORAL
  Filled 2017-08-02 (×4): qty 1

## 2017-08-02 MED ORDER — ONDANSETRON HCL 4 MG PO TABS: 4.0000 mg | ORAL_TABLET | Freq: Four times a day (QID) | ORAL | Status: DC | PRN

## 2017-08-02 MED ORDER — ASPIRIN 81 MG PO CHEW
81.0000 mg | CHEWABLE_TABLET | Freq: Two times a day (BID) | ORAL | Status: DC
  Administered 2017-08-02 – 2017-08-05 (×6): 81 mg via ORAL
  Filled 2017-08-02 (×6): qty 1

## 2017-08-02 MED ORDER — FESOTERODINE FUMARATE ER 8 MG PO TB24
8.0000 mg | ORAL_TABLET | Freq: Every day | ORAL | Status: DC
  Administered 2017-08-03 – 2017-08-04 (×2): 8 mg via ORAL
  Filled 2017-08-02 (×2): qty 1

## 2017-08-02 MED ORDER — HYDROMORPHONE HCL 1 MG/ML IJ SOLN
INTRAMUSCULAR | Status: AC
  Filled 2017-08-02: qty 1

## 2017-08-02 MED ORDER — METHOCARBAMOL 500 MG PO TABS
ORAL_TABLET | ORAL | Status: AC
  Administered 2017-08-02: 500 mg
  Filled 2017-08-02: qty 1

## 2017-08-02 MED ORDER — CEFAZOLIN SODIUM-DEXTROSE 2-4 GM/100ML-% IV SOLN
INTRAVENOUS | Status: AC
  Filled 2017-08-02: qty 100

## 2017-08-02 MED ORDER — LACTATED RINGERS IV SOLN
INTRAVENOUS | Status: DC | PRN
  Administered 2017-08-02 (×2): via INTRAVENOUS

## 2017-08-02 MED ORDER — METHOCARBAMOL 1000 MG/10ML IJ SOLN
500.0000 mg | Freq: Four times a day (QID) | INTRAVENOUS | Status: DC | PRN
  Filled 2017-08-02: qty 5

## 2017-08-02 MED ORDER — ASPIRIN EC 81 MG PO TBEC: 81.0000 mg | DELAYED_RELEASE_TABLET | Freq: Two times a day (BID) | ORAL | 0 refills | Status: AC

## 2017-08-02 MED ORDER — PROPOFOL 500 MG/50ML IV EMUL
INTRAVENOUS | Status: DC | PRN
  Administered 2017-08-02: 75 ug/kg/min via INTRAVENOUS

## 2017-08-02 MED ORDER — ONDANSETRON HCL 4 MG/2ML IJ SOLN
INTRAMUSCULAR | Status: DC | PRN
  Administered 2017-08-02: 4 mg via INTRAVENOUS

## 2017-08-02 MED ORDER — KCL IN DEXTROSE-NACL 20-5-0.45 MEQ/L-%-% IV SOLN
INTRAVENOUS | Status: DC
  Administered 2017-08-02 – 2017-08-03 (×2): via INTRAVENOUS
  Filled 2017-08-02 (×2): qty 1000

## 2017-08-02 MED ORDER — MEPERIDINE HCL 50 MG/ML IJ SOLN: 6.2500 mg | INTRAMUSCULAR | Status: DC | PRN

## 2017-08-02 MED ORDER — DOCUSATE SODIUM 100 MG PO CAPS
100.0000 mg | ORAL_CAPSULE | Freq: Two times a day (BID) | ORAL | Status: DC
  Administered 2017-08-02 – 2017-08-05 (×6): 100 mg via ORAL
  Filled 2017-08-02 (×6): qty 1

## 2017-08-02 MED ORDER — METOCLOPRAMIDE HCL 5 MG PO TABS: 5.0000 mg | ORAL_TABLET | Freq: Three times a day (TID) | ORAL | Status: DC | PRN

## 2017-08-02 MED ORDER — DULOXETINE HCL 60 MG PO CPEP
60.0000 mg | ORAL_CAPSULE | Freq: Two times a day (BID) | ORAL | Status: DC
  Administered 2017-08-02 – 2017-08-05 (×6): 60 mg via ORAL
  Filled 2017-08-02 (×6): qty 1

## 2017-08-02 MED ORDER — FENTANYL CITRATE (PF) 250 MCG/5ML IJ SOLN
INTRAMUSCULAR | Status: AC
  Filled 2017-08-02: qty 5

## 2017-08-02 MED ORDER — CLONAZEPAM 1 MG PO TABS
2.0000 mg | ORAL_TABLET | Freq: Three times a day (TID) | ORAL | Status: DC
  Administered 2017-08-02 – 2017-08-05 (×10): 2 mg via ORAL
  Filled 2017-08-02 (×10): qty 2

## 2017-08-02 MED ORDER — HYDROMORPHONE HCL 1 MG/ML IJ SOLN
0.2500 mg | INTRAMUSCULAR | Status: DC | PRN
  Administered 2017-08-02 (×3): 0.5 mg via INTRAVENOUS

## 2017-08-02 MED ORDER — FENTANYL CITRATE (PF) 100 MCG/2ML IJ SOLN
INTRAMUSCULAR | Status: DC | PRN
  Administered 2017-08-02: 50 ug via INTRAVENOUS

## 2017-08-02 MED ORDER — POLYETHYLENE GLYCOL 3350 17 G PO PACK: 17.0000 g | PACK | Freq: Every day | ORAL | Status: DC | PRN

## 2017-08-02 MED ORDER — BISACODYL 5 MG PO TBEC: 5.0000 mg | DELAYED_RELEASE_TABLET | Freq: Every day | ORAL | Status: DC | PRN

## 2017-08-02 MED ORDER — CHLORHEXIDINE GLUCONATE 4 % EX LIQD: 60.0000 mL | Freq: Once | CUTANEOUS | Status: DC

## 2017-08-02 MED ORDER — LACTATED RINGERS IV SOLN: INTRAVENOUS | Status: DC

## 2017-08-02 MED ORDER — MIDAZOLAM HCL 5 MG/5ML IJ SOLN
INTRAMUSCULAR | Status: DC | PRN
  Administered 2017-08-02 (×2): 1 mg via INTRAVENOUS

## 2017-08-02 MED ORDER — BUPIVACAINE LIPOSOME 1.3 % IJ SUSP
INTRAMUSCULAR | Status: DC | PRN
  Administered 2017-08-02: 20 mL

## 2017-08-02 MED ORDER — METHOCARBAMOL 500 MG PO TABS
500.0000 mg | ORAL_TABLET | Freq: Four times a day (QID) | ORAL | Status: DC | PRN
  Administered 2017-08-03 – 2017-08-05 (×5): 500 mg via ORAL
  Filled 2017-08-02 (×5): qty 1

## 2017-08-02 MED ORDER — OXYCODONE HCL 5 MG PO TABS
ORAL_TABLET | ORAL | Status: AC
  Filled 2017-08-02: qty 2

## 2017-08-02 MED ORDER — MIDAZOLAM HCL 2 MG/2ML IJ SOLN
INTRAMUSCULAR | Status: AC
  Filled 2017-08-02: qty 2

## 2017-08-02 MED ORDER — METOCLOPRAMIDE HCL 5 MG/ML IJ SOLN: 5.0000 mg | Freq: Three times a day (TID) | INTRAMUSCULAR | Status: DC | PRN

## 2017-08-02 MED ORDER — CEFUROXIME SODIUM 750 MG IJ SOLR
INTRAMUSCULAR | Status: AC
Start: 2017-08-02 — End: ?
  Filled 2017-08-02: qty 1500

## 2017-08-02 MED ORDER — SODIUM CHLORIDE 0.9 % IJ SOLN
INTRAMUSCULAR | Status: DC | PRN
  Administered 2017-08-02: 50 mL

## 2017-08-02 MED ORDER — SODIUM CHLORIDE 0.9 % IR SOLN
Status: DC | PRN
  Administered 2017-08-02: 3000 mL

## 2017-08-02 MED ORDER — METOCLOPRAMIDE HCL 5 MG/ML IJ SOLN: 10.0000 mg | Freq: Once | INTRAMUSCULAR | Status: DC | PRN

## 2017-08-02 MED ORDER — ALUM & MAG HYDROXIDE-SIMETH 200-200-20 MG/5ML PO SUSP: 30.0000 mL | ORAL | Status: DC | PRN

## 2017-08-02 MED ORDER — LEVOTHYROXINE SODIUM 112 MCG PO TABS
112.0000 ug | ORAL_TABLET | Freq: Every day | ORAL | Status: DC
  Administered 2017-08-03 – 2017-08-05 (×4): 112 ug via ORAL
  Filled 2017-08-02 (×4): qty 1

## 2017-08-02 MED ORDER — OXYCODONE HCL 5 MG PO TABS
5.0000 mg | ORAL_TABLET | ORAL | Status: DC | PRN
  Administered 2017-08-02 (×2): 10 mg via ORAL
  Administered 2017-08-03: 5 mg via ORAL
  Administered 2017-08-03 – 2017-08-04 (×4): 10 mg via ORAL
  Filled 2017-08-02: qty 2
  Filled 2017-08-02: qty 1
  Filled 2017-08-02 (×5): qty 2

## 2017-08-02 MED ORDER — CEFAZOLIN SODIUM-DEXTROSE 2-4 GM/100ML-% IV SOLN
2.0000 g | INTRAVENOUS | Status: AC
  Administered 2017-08-02: 2 g via INTRAVENOUS

## 2017-08-02 MED ORDER — PHENOL 1.4 % MT LIQD: 1.0000 | OROMUCOSAL | Status: DC | PRN

## 2017-08-02 MED ORDER — MENTHOL 3 MG MT LOZG: 1.0000 | LOZENGE | OROMUCOSAL | Status: DC | PRN

## 2017-08-02 MED ORDER — SODIUM FLUORIDE 1.1 % DT GEL: 1.0000 "application " | Freq: Every day | DENTAL | Status: DC

## 2017-08-02 MED ORDER — DEXTROSE 5 % IV SOLN
INTRAVENOUS | Status: DC | PRN
  Administered 2017-08-02: 20 ug/min via INTRAVENOUS

## 2017-08-02 SURGICAL SUPPLY — 80 items
ADAPTER BOLT FEMORAL +2/-2 (Knees) ×2 IMPLANT
ADPR FEM +2/-2 OFST BOLT (Knees) ×1 IMPLANT
ADPR FEM 5D STRL KN PFC SGM (Orthopedic Implant) ×1 IMPLANT
BANDAGE ACE 4X5 VEL STRL LF (GAUZE/BANDAGES/DRESSINGS) ×3 IMPLANT
BANDAGE ACE 6X5 VEL STRL LF (GAUZE/BANDAGES/DRESSINGS) ×3 IMPLANT
BANDAGE ESMARK 6X9 LF (GAUZE/BANDAGES/DRESSINGS) ×1 IMPLANT
BLADE CLIPPER SURG (BLADE) IMPLANT
BLADE SAG 18X100X1.27 (BLADE) ×3 IMPLANT
BLADE SAW SAG 90X13X1.27 (BLADE) ×3 IMPLANT
BNDG CMPR 9X6 STRL LF SNTH (GAUZE/BANDAGES/DRESSINGS) ×1
BNDG ESMARK 6X9 LF (GAUZE/BANDAGES/DRESSINGS) ×3
BOWL SMART MIX CTS (DISPOSABLE) ×3 IMPLANT
BRUSH FEMORAL CANAL (MISCELLANEOUS) ×2 IMPLANT
CEMENT HV SMART SET (Cement) ×6 IMPLANT
COVER BACK TABLE 24X17X13 BIG (DRAPES) IMPLANT
COVER SURGICAL LIGHT HANDLE (MISCELLANEOUS) ×3 IMPLANT
CUFF TOURNIQUET SINGLE 34IN LL (TOURNIQUET CUFF) ×3 IMPLANT
CUFF TOURNIQUET SINGLE 44IN (TOURNIQUET CUFF) IMPLANT
DISC DIAMOND MED (BURR) IMPLANT
DRAPE HALF SHEET 40X57 (DRAPES) ×3 IMPLANT
DRAPE IMP U-DRAPE 54X76 (DRAPES) ×3 IMPLANT
DRAPE U-SHAPE 47X51 STRL (DRAPES) ×3 IMPLANT
DRSG AQUACEL AG ADV 3.5X14 (GAUZE/BANDAGES/DRESSINGS) ×2 IMPLANT
DURAPREP 26ML APPLICATOR (WOUND CARE) ×3 IMPLANT
ELECT REM PT RETURN 9FT ADLT (ELECTROSURGICAL) ×3
ELECTRODE REM PT RTRN 9FT ADLT (ELECTROSURGICAL) ×1 IMPLANT
EVACUATOR 1/8 PVC DRAIN (DRAIN) IMPLANT
FEM TC3 PFC SIGMA SZ5 (Orthopedic Implant) ×3 IMPLANT
FEMORAL ADAPTER (Orthopedic Implant) ×2 IMPLANT
FEMORAL TC3 PFC SIGMA SZ5 (Orthopedic Implant) IMPLANT
GAUZE SPONGE 4X4 12PLY STRL (GAUZE/BANDAGES/DRESSINGS) ×6 IMPLANT
GAUZE XEROFORM 1X8 LF (GAUZE/BANDAGES/DRESSINGS) ×3 IMPLANT
GLOVE BIO SURGEON STRL SZ7.5 (GLOVE) ×3 IMPLANT
GLOVE BIO SURGEON STRL SZ8.5 (GLOVE) ×6 IMPLANT
GLOVE BIOGEL PI IND STRL 8 (GLOVE) ×2 IMPLANT
GLOVE BIOGEL PI IND STRL 9 (GLOVE) ×1 IMPLANT
GLOVE BIOGEL PI INDICATOR 8 (GLOVE) ×4
GLOVE BIOGEL PI INDICATOR 9 (GLOVE) ×2
GOWN STRL REUS W/ TWL LRG LVL3 (GOWN DISPOSABLE) ×1 IMPLANT
GOWN STRL REUS W/ TWL XL LVL3 (GOWN DISPOSABLE) ×3 IMPLANT
GOWN STRL REUS W/TWL LRG LVL3 (GOWN DISPOSABLE) ×3
GOWN STRL REUS W/TWL XL LVL3 (GOWN DISPOSABLE) ×9
HANDPIECE INTERPULSE COAX TIP (DISPOSABLE) ×3
HOOD PEEL AWAY FACE SHEILD DIS (HOOD) ×9 IMPLANT
INSERT TIB TC3 RP SZ5 10 (Knees) ×2 IMPLANT
KIT BASIN OR (CUSTOM PROCEDURE TRAY) ×3 IMPLANT
KIT TURNOVER KIT B (KITS) ×3 IMPLANT
MANIFOLD NEPTUNE II (INSTRUMENTS) ×3 IMPLANT
NDL SPNL 18GX3.5 QUINCKE PK (NEEDLE) ×1 IMPLANT
NEEDLE SPNL 18GX3.5 QUINCKE PK (NEEDLE) ×3 IMPLANT
NS IRRIG 1000ML POUR BTL (IV SOLUTION) ×3 IMPLANT
PACK TOTAL JOINT (CUSTOM PROCEDURE TRAY) ×3 IMPLANT
PACK UNIVERSAL I (CUSTOM PROCEDURE TRAY) ×3 IMPLANT
PAD ARMBOARD 7.5X6 YLW CONV (MISCELLANEOUS) ×6 IMPLANT
PAD CAST 4YDX4 CTTN HI CHSV (CAST SUPPLIES) ×1 IMPLANT
PADDING CAST COTTON 4X4 STRL (CAST SUPPLIES) ×3
PADDING CAST COTTON 6X4 STRL (CAST SUPPLIES) ×3 IMPLANT
PADDING CAST SYNTHETIC 4 (CAST SUPPLIES) ×2
PADDING CAST SYNTHETIC 4X4 STR (CAST SUPPLIES) IMPLANT
PATELLA DOME PFC 38MM (Knees) ×2 IMPLANT
RASP HELIOCORDIAL MED (MISCELLANEOUS) IMPLANT
SET HNDPC FAN SPRY TIP SCT (DISPOSABLE) ×1 IMPLANT
STAPLER VISISTAT 35W (STAPLE) ×3 IMPLANT
STEM UNIVERSAL 115X12MM (Stem) ×2 IMPLANT
STEM UNIVERSAL 150X12MM (Stem) ×2 IMPLANT
SUCTION FRAZIER HANDLE 10FR (MISCELLANEOUS) ×2
SUCTION TUBE FRAZIER 10FR DISP (MISCELLANEOUS) ×1 IMPLANT
SUT VIC AB 1 CTX 36 (SUTURE) ×3
SUT VIC AB 1 CTX36XBRD ANBCTR (SUTURE) ×1 IMPLANT
SUT VIC AB 2-0 CT1 27 (SUTURE) ×3
SUT VIC AB 2-0 CT1 TAPERPNT 27 (SUTURE) ×1 IMPLANT
SUT VIC AB 3-0 CT1 27 (SUTURE) ×3
SUT VIC AB 3-0 CT1 27XBRD (SUTURE) ×1 IMPLANT
SWAB CULTURE ESWAB REG 1ML (MISCELLANEOUS) IMPLANT
SYR 50ML LL SCALE MARK (SYRINGE) ×3 IMPLANT
TOWEL OR 17X24 6PK STRL BLUE (TOWEL DISPOSABLE) ×3 IMPLANT
TOWEL OR 17X26 10 PK STRL BLUE (TOWEL DISPOSABLE) ×3 IMPLANT
TRAY FOLEY W/BAG SLVR 14FR (SET/KITS/TRAYS/PACK) IMPLANT
TRAY REVISION SZ 4 (Knees) ×2 IMPLANT
WATER STERILE IRR 1000ML POUR (IV SOLUTION) ×9 IMPLANT

## 2017-08-02 NOTE — Evaluation (Signed)
Physical Therapy Evaluation Patient Details Name: Lisa Duke MRN: [id number] DOB: 1945-02-17 Today's Date: 08/02/2017   History of Present Illness  Pt is a 72 y/o female s/p elective R total knee revision. PMH includes RA, fibromyalgia, and R TKA.   Clinical Impression  Pt is s/p surgery above with deficits below. Pt very sleepy during session and presenting with some dizziness, therefore mobility limited to chair. Pt unsteady with LOB during stand pivot; required mod A for stability with RW. BP checked and WFL. Educated about knee precautions and supine HEP. Will continue to follow acutely to maximize functional mobility independence and safety.     Follow Up Recommendations Follow surgeon's recommendation for DC plan and follow-up therapies;Supervision for mobility/OOB    Equipment Recommendations  None recommended by PT    Recommendations for Other Services OT consult     Precautions / Restrictions Precautions Precautions: Knee Precaution Booklet Issued: Yes (comment) Precaution Comments: Reviewed knee precautions with pt, however, will need further review.  Restrictions Weight Bearing Restrictions: Yes RLE Weight Bearing: Weight bearing as tolerated      Mobility  Bed Mobility Overal bed mobility: Needs Assistance Bed Mobility: Supine to Sit     Supine to sit: Mod assist     General bed mobility comments: Mod A for LE assist and trunk elevation. Increased time required.   Transfers Overall transfer level: Needs assistance Equipment used: Rolling walker (2 wheeled) Transfers: Sit to/from [facility] Sit to Stand: Mod assist;From elevated surface Stand pivot transfers: Mod assist       General transfer comment: Mod A from elevated surface for lift assist and steadying. Mod A for steadying assist during transfer. Pt unsteady and reporting some dizziness. Required verbal cues for sequencing using RW.   Ambulation/Gait              General Gait Details: Deferred   Stairs            Wheelchair Mobility    Modified Rankin (Stroke Patients Only)       Balance Overall balance assessment: Needs assistance Sitting-balance support: No upper extremity supported;Feet supported Sitting balance-Leahy Scale: Fair     Standing balance support: Bilateral upper extremity supported;During functional activity Standing balance-Leahy Scale: Poor Standing balance comment: Reliant on BUE and external support.                              Pertinent Vitals/Pain Pain Assessment: Faces Faces Pain Scale: Hurts little more Pain Location: R knee  Pain Descriptors / Indicators: Aching;Operative site guarding Pain Intervention(s): Limited activity within patient's tolerance;Monitored during session;Repositioned    Home Living Family/patient expects to be discharged to:: Private residence Living Arrangements: Spouse/significant other Available Help at Discharge: Family;Available 24 hours/day Type of Home: House Home Access: Stairs to enter Entrance Stairs-Rails: Right;Left;Can reach both Entrance Stairs-Number of Steps: 4 Home Layout: Two level;Able to live on main level with bedroom/bathroom Home Equipment: Gilford Rile - 2 wheels;Shower seat;Electric scooter;Wheelchair - manual;Toilet riser      Prior Function Level of Independence: Independent with assistive device(s)         Comments: Reports using RW for mobility.      Hand Dominance        Extremity/Trunk Assessment   Upper Extremity Assessment Upper Extremity Assessment: Defer to OT evaluation    Lower Extremity Assessment Lower Extremity Assessment: RLE deficits/detail RLE Deficits / Details: SEnsory in tact. Deficits consistent with post op  pain and weakness.        Communication   Communication: No difficulties  Cognition Arousal/Alertness: Suspect due to medications Behavior During Therapy: WFL for tasks assessed/performed Overall  Cognitive Status: Within Functional Limits for tasks assessed                                 General Comments: Pt sleepy during mobility, as pt had just received pain medications.       General Comments General comments (skin integrity, edema, etc.): Checked BP at end of session and BP at 119/61 mmHg.     Exercises Total Joint Exercises Ankle Circles/Pumps: AROM;Both;20 reps Quad Sets: AROM;Right;10 reps Heel Slides: AAROM;Right;10 reps   Assessment/Plan    PT Assessment Patient needs continued PT services  PT Problem List Decreased strength;Decreased activity tolerance;Decreased balance;Decreased mobility;Decreased knowledge of use of DME;Decreased knowledge of precautions;Pain       PT Treatment Interventions DME instruction;Gait training;Stair training;Functional mobility training;Therapeutic activities;Therapeutic exercise;Balance training;Patient/family education    PT Goals (Current goals can be found in the Care Plan section)  Acute Rehab PT Goals Patient Stated Goal: to go home  PT Goal Formulation: With patient Time For Goal Achievement: 08/16/17 Potential to Achieve Goals: Good    Frequency 7X/week   Barriers to discharge        Co-evaluation               AM-PAC PT "6 Clicks" Daily Activity  Outcome Measure Difficulty turning over in bed (including adjusting bedclothes, sheets and blankets)?: A Little Difficulty moving from lying on back to sitting on the side of the bed? : Unable Difficulty sitting down on and standing up from a chair with arms (e.g., wheelchair, bedside commode, etc,.)?: Unable Help needed moving to and from a bed to chair (including a wheelchair)?: A Lot Help needed walking in hospital room?: A Lot Help needed climbing 3-5 steps with a railing? : Total 6 Click Score: 10    End of Session Equipment Utilized During Treatment: Gait belt Activity Tolerance: Treatment limited secondary to medical complications  (Comment);Patient limited by lethargy(dizziness ) Patient left: in chair;with call bell/phone within reach Nurse Communication: Mobility status PT Visit Diagnosis: Unsteadiness on feet (R26.81);Other abnormalities of gait and mobility (R26.89);Pain Pain - Right/Left: Right Pain - part of body: Knee    Time: 4709-2957 PT Time Calculation (min) (ACUTE ONLY): 29 min   Charges:   PT Evaluation $PT Eval Low Complexity: 1 Low PT Treatments $Therapeutic Activity: 8-22 mins   PT G Codes:        Leighton Ruff, PT, DPT  Acute Rehabilitation Services  Pager: [phone]   Rudean Hitt 08/02/2017, 5:54 PM

## 2017-08-02 NOTE — Interval H&P Note (Signed)
History and Physical Interval Note:  08/02/2017 7:07 AM  Lisa Duke  has presented today for surgery, with the diagnosis of LOOSE RIGHT TOTAL KNEE ARTHROPLASTY  The various methods of treatment have been discussed with the patient and family. After consideration of risks, benefits and other options for treatment, the patient has consented to  Procedure(s): RIGHT TOTAL KNEE REVISION (Right) as a surgical intervention .  The patient's history has been reviewed, patient examined, no change in status, stable for surgery.  I have reviewed the patient's chart and labs.  Questions were answered to the patient's satisfaction.     Kerin Salen

## 2017-08-02 NOTE — Anesthesia Procedure Notes (Signed)
Procedure Name: MAC Date/Time: 08/02/2017 7:46 AM Performed by: Candis Shine, CRNA Pre-anesthesia Checklist: Patient identified, Emergency Drugs available, Suction available, Patient being monitored and Timeout performed Patient Re-evaluated:Patient Re-evaluated prior to induction Oxygen Delivery Method: Simple face mask Dental Injury: Teeth and Oropharynx as per pre-operative assessment

## 2017-08-02 NOTE — Op Note (Signed)
PATIENT ID:      Lisa Duke  MRN:     [id number] DOB/AGE:    73-Oct-1947 / 73 y.o.       OPERATIVE REPORT    DATE OF PROCEDURE:  08/02/2017       PREOPERATIVE DIAGNOSIS:   LOOSE RIGHT TOTAL KNEE ARTHROPLASTY      Estimated body mass index is 27.7 kg/m as calculated from the following:   Height as of this encounter: 5' 5.5" (1.664 m).   Weight as of this encounter: 169 lb (76.7 kg).                                                        POSTOPERATIVE DIAGNOSIS:   LOOSE RIGHT TOTAL KNEE ARTHROPLASTY                                                                      PROCEDURE:  Procedure(s): RIGHT TOTAL KNEE REVISION with removal of all previously placed DePuy LCS implants and revision to Depuy Sigma RP implants #4 right TC 3 femur, 150 x 12 mm femoral stem, #4 MBT tibia, 12 x 115 tibial stem 62mm Sigma TC 3 RP bearing, 38 patella, all components cemented     SURGEON: Kerin Salen    ASSISTANT:   Kerry Hough. [employer]   (Present and scrubbed throughout the case, critical for assistance with exposure, retraction, instrumentation, and closure.)         ANESTHESIA: Spinal, Exparel  EBL: 400 cc  FLUID REPLACEMENT: 1800 cc crystalloid TOURNIQUET TIME: 0 min  DRAINS: none  Tranexamic Acid: 1 g IV, 2 g topical  COMPLICATIONS:  None        INDICATIONS FOR PROCEDURE: The patient has  LOOSE RIGHT TOTAL KNEE ARTHROPLASTY.  Primary knee was a DePuy LCS placed in 2004 cemented the femoral component has come loose falling into valgus there is no obvious loosening of the tibial or patellar components although the patella will need to be revised since we will be using the Sigma RP system.  There is a chance the tibial component is loose as well.  Patient has severe unremitting pain and instability of the knee and gross loosening on plain x-rays.  Risks and benefits of surgery have been discussed, questions answered.   DESCRIPTION OF PROCEDURE: The patient identified by armband, received  IV  antibiotics, in the holding area at [facility]. Patient taken to the operating room, appropriate anesthetic monitors were attached, and general endotracheal anesthesia induced with the patient in supine position. Tourniquet applied high to the operative thigh. Lateral post and foot positioner applied to the table, the lower extremity was then prepped and draped in usual sterile fashion from the ankle to the tourniquet. Time-out procedure was performed.  We began the operation with the knee flexed 120 degrees, by making an anterior midline incision starting at handbreadth above the patella going over the patella 1 cm medial to and 4 cm distal to the tibial tubercle. Small bleeders in the skin and the subcutaneous tissue identified and cauterized. Transverse retinaculum  was incised and reflected medially and a medial parapatellar arthrotomy was accomplished.  Old Ethibond sutures around the parapatellar arthrotomy were removed with a small rondure.  Scar tissue was excised from underneath the patellar implant we elevated the medial collateral ligament using electrocautery going from anterior to posterior and distal to proximal.  This allowed [place] to sublux the patella laterally and removed more scar tissue around the grossly loose femur.  At this point were able to evert the patella without too much difficulty and continue to work our way around posteriorly with the knee flexed.  The RP bearing subluxed out from underneath the femur and was easily removed the femoral component itself simply came off with manual pressure as it was also grossly loose.  We set about removing loose bits of cement and scar tissue from around the distal femur medially the bone was good quality laterally there was about a 1 cm defect.  We then directed our attention to the patella and using a small ACL so I removed the uncemented patella without much difficulty.  We freshened up to patellar cut with a saw and sized for 38 button and  drilled.  We then evaluated the tibial component with the knee flexed unfortunately easily popped off with 1/4 inch osteotome and a small mallet and remove the loose cement from underneath where the tibial component had been.  After all the cemented been removed from the heel of the tibial component we sequentially reamed up to a 12 mm reamer to the appropriate depth for a 12 mm x 115 tibial stem we left the reamer in place with a 12 mm collar and remove the proximal 2 mm of the tibia without difficulty and sized for a 4 tibial implant.  We then directed our attention back to the femur and entered the distal femur with a step reamer followed by manual reaming up to a 12 reamer to the appropriate depth for a 150 stem.  With a reamer in place we then freshened up the distal femoral cut applied the box cutter and only cut medially because there was a lateral deficiency of about a centimeter and then performed to the notch cut without much difficulty for a #5 TC 3 femur.  We then assembled trials for the tibia and the femur and inserted them without difficulty a 10 mm RP bearing had the best fit and ligamentous stability was excellent.  At this point all trial components removed all bony surfaces were WaterPik cleaned and dried with suction and sponges Exparel was then injected into the soft tissues posteriorly medially and laterally without difficulty.  A triple batch of DePuy cement was then mixed and applied to the bony metallic mating surfaces except for the stems themselves.  In order we hammered into place a 4 MBT tibial component with a 12 x 115 stem followed by a 5 TC 3 right femoral component with a 12 x 150 stem.  After the implants been impacted in the place we placed a 10 mm RP MBT TC 3 bearing we then squeezed into place the 38 mm patellar button excess cement was removed and the cement was allowed to harden.  The wound was once again [facility] to clean excess cement was checked one more time the rest of the  Exparel was injected in the soft tissues.  We closed with running #1 Vicryl suture in the patellar tendon 2-0 Vicryl suture in the subcutaneous tissue and 3-0 Vicryl suture subcuticular tissue.  A dressing  of Aquacil and large Ace wrap was then applied.  Patient was taken to the recovery room without difficulty.   The subcutaneous tissue with 0 and 2-0 undyed Vicryl suture, and the skin with3-0 vicryl.  A dressing of Xeroform, 4 x 4, dressing sponges, Webril, and Ace wrap applied. The patient awakened, extubated, and taken to recovery room without difficulty.   Kerin Salen 08/02/2017, 9:42 AM

## 2017-08-02 NOTE — Anesthesia Postprocedure Evaluation (Signed)
Anesthesia Post Note  Patient: Lisa Duke  Procedure(s) Performed: RIGHT TOTAL KNEE REVISION (Right Knee)     Patient location during evaluation: PACU Anesthesia Type: Spinal Level of consciousness: oriented and awake and alert Pain management: pain level controlled Vital Signs Assessment: post-procedure vital signs reviewed and stable Respiratory status: spontaneous breathing, respiratory function stable and nonlabored ventilation Cardiovascular status: blood pressure returned to baseline and stable Postop Assessment: no headache, no backache, no apparent nausea or vomiting, spinal receding and patient able to bend at knees Anesthetic complications: no    Last Vitals:  Vitals:   08/02/17 1130 08/02/17 1145  BP: 91/63 98/63  Pulse: 75 73  Resp: (!) 27 19  Temp:    SpO2: 94% 96%    Last Pain:  Vitals:   08/02/17 1145  TempSrc:   PainSc: 0-No pain            L Sensory Level: S1-Sole of foot, small toes (08/02/17 1145) R Sensory Level: S1-Sole of foot, small toes (08/02/17 1145)  Carlea Badour A.

## 2017-08-02 NOTE — Anesthesia Procedure Notes (Addendum)
Spinal  Patient location during procedure: OR Start time: 08/02/2017 7:44 AM Staffing Anesthesiologist: Josephine Igo, MD Performed: anesthesiologist  Preanesthetic Checklist Completed: patient identified, site marked, surgical consent, pre-op evaluation, timeout performed, IV checked, risks and benefits discussed and monitors and equipment checked Spinal Block Patient position: sitting Prep: site prepped and draped and DuraPrep Patient monitoring: heart rate, cardiac monitor, continuous pulse ox and blood pressure Approach: midline Location: L3-4 Injection technique: single-shot Needle Needle type: Pencan  Needle gauge: 24 G Needle length: 9 cm Needle insertion depth: 7 cm Assessment Sensory level: T4 Additional Notes Patient tolerated procedure well. Adequate sensory level. Attempt x 2.

## 2017-08-02 NOTE — Social Work (Signed)
CSW acknowledging consult for SNF placement, will follow for PT/OT recommendations. [place], [place] Work [phone]

## 2017-08-02 NOTE — Progress Notes (Signed)
Orthopedic Tech Progress Note Patient Details:  Lisa Duke [birth year] [id number]  Ortho Devices Type of Ortho Device: Bone foam zero knee Ortho Device/Splint Location: Trapeze bar Ortho Device/Splint Interventions: Application   Post Interventions Patient Tolerated: Well Instructions Provided: Care of device   [name] 08/02/2017, 11:37 AM

## 2017-08-02 NOTE — Discharge Instructions (Signed)

## 2017-08-02 NOTE — Anesthesia Procedure Notes (Signed)
Anesthesia Regional Block: Adductor canal block   Pre-Anesthetic Checklist: ,, timeout performed, Correct Patient, Correct Site, Correct Laterality, Correct Procedure, Correct Position, site marked, Risks and benefits discussed,  Surgical consent,  Pre-op evaluation,  At surgeon's request and post-op pain management  Laterality: Right  Prep: chloraprep       Needles:  Injection technique: Single-shot  Needle Type: Echogenic Stimulator Needle     Needle Length: 9cm  Needle Gauge: 21   Needle insertion depth: 6 cm   Additional Needles:   Procedures:,,,, ultrasound used (permanent image in chart),,,,  Narrative:  Start time: 08/02/2017 7:13 AM End time: 08/02/2017 7:17 AM Injection made incrementally with aspirations every 5 mL.  Performed by: Personally  Anesthesiologist: Josephine Igo, MD  Additional Notes: Timeout performed. Patient sedated. Relevant anatomy ID'd using [place]. Incremental 2-39ml injection of LA with frequent aspiration. Patient tolerated procedure well.

## 2017-08-02 NOTE — Transfer of Care (Signed)
Immediate Anesthesia Transfer of Care Note  Patient: Lisa Duke  Procedure(s) Performed: RIGHT TOTAL KNEE REVISION (Right Knee)  Patient Location: PACU  Anesthesia Type:Spinal and MAC combined with regional for post-op pain  Level of Consciousness: awake, alert  and oriented  Airway & Oxygen Therapy: Patient Spontanous Breathing and Patient connected to face mask oxygen  Post-op Assessment: Report given to RN and Post -op Vital signs reviewed and stable  Post vital signs: Reviewed and stable  Last Vitals:  Vitals Value Taken Time  BP 92/52 08/02/2017 10:17 AM  Temp    Pulse 69 08/02/2017 10:19 AM  Resp 16 08/02/2017 10:19 AM  SpO2 100 % 08/02/2017 10:19 AM  Vitals shown include unvalidated device data.  Last Pain:  Vitals:   08/02/17 0551  TempSrc:   PainSc: 9       Patients Stated Pain Goal: 2 (03/00/92 3300)  Complications: No apparent anesthesia complications

## 2017-08-03 LAB — CBC
HEMATOCRIT: 38.2 % (ref 36.0–46.0)
HEMOGLOBIN: 12.1 g/dL (ref 12.0–15.0)
MCH: 30.2 pg (ref 26.0–34.0)
MCHC: 31.7 g/dL (ref 30.0–36.0)
MCV: 95.3 fL (ref 78.0–100.0)
Platelets: 224 10*3/uL (ref 150–400)
RBC: 4.01 MIL/uL (ref 3.87–5.11)
RDW: 14.7 % (ref 11.5–15.5)
WBC: 10.9 10*3/uL — ABNORMAL HIGH (ref 4.0–10.5)

## 2017-08-03 LAB — BASIC METABOLIC PANEL
Anion gap: 5 (ref 5–15)
BUN: 6 mg/dL (ref 6–20)
CHLORIDE: 101 mmol/L (ref 101–111)
CO2: 27 mmol/L (ref 22–32)
CREATININE: 0.68 mg/dL (ref 0.44–1.00)
Calcium: 8.2 mg/dL — ABNORMAL LOW (ref 8.9–10.3)
GFR calc non Af Amer: >60 mL/min (ref >60)
Glucose, Bld: 122 mg/dL — ABNORMAL HIGH (ref 65–99)
Potassium: 4.1 mmol/L (ref 3.5–5.1)
Sodium: 133 mmol/L — ABNORMAL LOW (ref 135–145)

## 2017-08-03 NOTE — Progress Notes (Signed)
Physical Therapy Treatment Patient Details Name: Lisa Duke MRN: [id number] DOB: December 23, 1945 Today's Date: 08/03/2017    History of Present Illness Pt is a 72 y/o female s/p elective R total knee revision. PMH includes RA, fibromyalgia, and R TKA.     PT Comments    Pt remains very limited with functional mobility secondary to pain and weakness. Pt required mod A for bed mobility and mod A for transfers with RW. Pt is adamant about returning home with HHPT and family support; however, she will need to make some major progress in functional mobility prior to d/c or may need to consider ST rehab at a SNF prior to returning home. PT will continue to follow acutely and progress mobility as tolerated.  Pt would continue to benefit from skilled physical therapy services at this time while admitted and after d/c to address the below listed limitations in order to improve overall safety and independence with functional mobility.    Follow Up Recommendations  Other (comment)(pt adamant about returning home with HHPT) - may need ST rehab at SNF prior to returning home pending mobility progress     Equipment Recommendations  None recommended by PT    Recommendations for Other Services       Precautions / Restrictions Precautions Precautions: Knee Precaution Booklet Issued: Yes (comment) Restrictions Weight Bearing Restrictions: Yes RLE Weight Bearing: Weight bearing as tolerated    Mobility  Bed Mobility Overal bed mobility: Needs Assistance Bed Mobility: Supine to Sit     Supine to sit: Mod assist     General bed mobility comments: pt required increased time and effort, mod A for R LE movement; pt able to elevate trunk independently  Transfers Overall transfer level: Needs assistance Equipment used: Rolling walker (2 wheeled) Transfers: Sit to/from [facility] Sit to Stand: Mod assist;From elevated surface Stand pivot transfers: Mod assist        General transfer comment: increased time and effort, good technique, elevated bed position, mod A to power into standing and for pivotal movements to chair  Ambulation/Gait                 Stairs             Wheelchair Mobility    Modified Rankin (Stroke Patients Only)       Balance Overall balance assessment: Needs assistance Sitting-balance support: No upper extremity supported;Feet supported Sitting balance-Leahy Scale: Fair     Standing balance support: Bilateral upper extremity supported;During functional activity Standing balance-Leahy Scale: Poor Standing balance comment: Reliant on BUE and external support.                             Cognition Arousal/Alertness: Awake/alert Behavior During Therapy: WFL for tasks assessed/performed Overall Cognitive Status: Within Functional Limits for tasks assessed                                        Exercises      General Comments        Pertinent Vitals/Pain Pain Assessment: Faces Faces Pain Scale: Hurts whole lot Pain Location: R knee  Pain Descriptors / Indicators: Aching;Operative site guarding Pain Intervention(s): Monitored during session;Repositioned    Home Living                      Prior  Function            PT Goals (current goals can now be found in the care plan section) Acute Rehab PT Goals PT Goal Formulation: With patient Time For Goal Achievement: 08/16/17 Potential to Achieve Goals: Good Progress towards PT goals: Progressing toward goals    Frequency    7X/week      PT Plan Current plan remains appropriate    Co-evaluation              AM-PAC PT "6 Clicks" Daily Activity  Outcome Measure  Difficulty turning over in bed (including adjusting bedclothes, sheets and blankets)?: Unable Difficulty moving from lying on back to sitting on the side of the bed? : Unable Difficulty sitting down on and standing up from a chair with  arms (e.g., wheelchair, bedside commode, etc,.)?: Unable Help needed moving to and from a bed to chair (including a wheelchair)?: A Lot Help needed walking in hospital room?: A Lot Help needed climbing 3-5 steps with a railing? : Total 6 Click Score: 8    End of Session Equipment Utilized During Treatment: Gait belt Activity Tolerance: Patient limited by fatigue Patient left: in chair;with call bell/phone within reach Nurse Communication: Mobility status PT Visit Diagnosis: Unsteadiness on feet (R26.81);Other abnormalities of gait and mobility (R26.89);Pain Pain - Right/Left: Right Pain - part of body: Knee     Time: 3212-2482 PT Time Calculation (min) (ACUTE ONLY): 20 min  Charges:  $Therapeutic Activity: 8-22 mins                    G Codes:       [place], [place], [place] 08/03/2017, 9:44 AM

## 2017-08-03 NOTE — Progress Notes (Signed)
Spoke with Gaspar Skeeters, PA on the phone about patient dressing on right knee. The wound was bleeding through the bandage. RN changed aquacel dressing, applied compression wrap, and applied ice to the right knee. Patient tolerated dressing change well. Will continue to monitor patient.

## 2017-08-03 NOTE — Progress Notes (Signed)
Subjective: 1 Day Post-Op Procedure(s) (LRB): RIGHT TOTAL KNEE REVISION (Right) Patient reports pain as moderate.  Complains of pain in her right knee.  No chest pain or shortness of breath.  Foley catheter still in place.  Objective: Vital signs in last 24 hours: Temp:  [97 F (36.1 C)-98.1 F (36.7 C)] 97.6 F (36.4 C) (06/22 0524) Pulse Rate:  [66-99] 98 (06/22 0524) Resp:  [11-27] 12 (06/21 1346) BP: (85-150)/(33-74) 150/72 (06/22 0524) SpO2:  [90 %-100 %] 92 % (06/22 0524)  Intake/Output from previous day: 06/21 0701 - 06/22 0700 In: 2584.7 [P.O.:420; I.V.:2164.7] Out: 1300 [Urine:1225; Blood:75] Intake/Output this shift: Total I/O In: -  Out: 1400 [Urine:1400]  Recent Labs    08/03/17 0337  HGB 12.1   Recent Labs    08/03/17 0337  WBC 10.9*  RBC 4.01  HCT 38.2  PLT 224   Recent Labs    08/03/17 0337  NA 133*  K 4.1  CL 101  CO2 27  BUN 6  CREATININE 0.68  GLUCOSE 122*  CALCIUM 8.2*   No results for input(s): LABPT, INR in the last 72 hours. Right knee exam: Neurovascular intact Sensation intact distally Intact pulses distally Dorsiflexion/Plantar flexion intact Incision: dressing C/D/I Compartment soft  Anticipated LOS equal to or greater than 2 midnights due to - Age 73 and older with one or more of the following:  - Obesity  - Expected need for hospital services (PT, OT, Nursing) required for safe  discharge  - Anticipated need for postoperative skilled nursing care or inpatient rehab  - Active co-morbidities: None OR   - Unanticipated findings during/Post Surgery: None  - Patient is a high risk of re-admission due to: None   Assessment/Plan: 1 Day Post-Op Procedure(s) (LRB): RIGHT TOTAL KNEE REVISION (Right) Plan: Up with therapy  Weight-bear as tolerated on right. Aspirin for DVT prophylaxis. DC Foley this a.m. Home in 1 to 2 days.    Erlene Senters 08/03/2017, 8:38 AM

## 2017-08-03 NOTE — Plan of Care (Signed)
  Problem: Education: Goal: Knowledge of General Education information will improve Outcome: Progressing   Problem: Nutrition: Goal: Adequate nutrition will be maintained Outcome: Progressing   Problem: Pain Managment: Goal: General experience of comfort will improve Outcome: Progressing

## 2017-08-03 NOTE — Care Management Note (Signed)
Case Management Note  Patient Details  Name: Lisa Duke MRN: [id number] Date of Birth: 05-May-1945  Subjective/Objective:     Pt presents for Right knee revision.  Pt plans to return home with husband and 2 friends to assist.  Pt is preoperatively set up with [facility] but is interested in finding out about [facility] program with [facility].     Pt has RW and 3n1.  Does not require any DME at dc.            Action/Plan: Tommi Rumps, liaison with [facility], will review chart and contact patient to discuss [facility] program.    Expected Discharge Date:                  Expected Discharge Plan:  [place]  In-House Referral:  NA  Discharge planning Services  CM Consult  Post Acute Care Choice:  Durable Medical Equipment, Home Health Choice offered to:  Patient  DME Arranged:  N/A(Pt has RW, 3n1) DME Agency:  NA  HH Arranged:    [place] Agency:     Status of Service:  In process, will continue to follow  If discussed at Long Length of Stay Meetings, dates discussed:    Additional Comments:  Claudie Leach, RN 08/03/2017, 4:40 PM

## 2017-08-03 NOTE — Evaluation (Signed)
Occupational Therapy Evaluation Patient Details Name: Lisa Duke MRN: [id number] DOB: 12-05-1945 Today's Date: 08/03/2017    History of Present Illness Pt is a 72 y/o female s/p elective R total knee revision. PMH includes RA, fibromyalgia, and R TKA.    Clinical Impression   PTA, pt was independent with RW for ADL and functional mobility. She currently is limited by R knee pain and decreased activity tolerance for ADL participation. She currently requires mod assist for LB ADL, min assist for short distance ambulating toilet transfers, and cues for safety throughout all tasks. She demonstrated slight confusion during session today with some decreased awareness and attention. Pt would benefit from continued OT services to maximize independence and safety with ADL and functional mobility prior to returning home with assistance from her husband. OT will continue to follow while admitted.    Follow Up Recommendations  No OT follow up;Supervision/Assistance - 24 hour    Equipment Recommendations  None recommended by OT(has needs met)    Recommendations for Other Services       Precautions / Restrictions Precautions Precautions: Knee Precaution Booklet Issued: No Restrictions Weight Bearing Restrictions: Yes RLE Weight Bearing: Weight bearing as tolerated      Mobility Bed Mobility Overal bed mobility: Needs Assistance Bed Mobility: Supine to Sit     Supine to sit: Mod assist     General bed mobility comments: OOB in recliner on my arrival.  Transfers Overall transfer level: Needs assistance Equipment used: Rolling walker (2 wheeled) Transfers: Sit to/from Stand Sit to Stand: Min assist Stand pivot transfers: Mod assist       General transfer comment: Increased effort requiring min assist to power up to standing position. Pt with noted R knee buckling with attempted weight bearing.     Balance Overall balance assessment: Needs assistance Sitting-balance  support: No upper extremity supported;Feet supported Sitting balance-Leahy Scale: Fair     Standing balance support: Bilateral upper extremity supported;During functional activity Standing balance-Leahy Scale: Poor Standing balance comment: Reliant on BUE and external support.                            ADL either performed or assessed with clinical judgement   ADL Overall ADL's : Needs assistance/impaired Eating/Feeding: Set up;Sitting   Grooming: Set up;Sitting   Upper Body Bathing: Set up;Sitting   Lower Body Bathing: Moderate assistance;Sit to/from stand   Upper Body Dressing : Set up;Sitting   Lower Body Dressing: Moderate assistance;Sit to/from stand   Toilet Transfer: Minimal assistance;Ambulation;RW Toilet Transfer Details (indicate cue type and reason): Simulated taking a few steps from recliner to bed.  Toileting- Clothing Manipulation and Hygiene: Moderate assistance;Sit to/from stand       Functional mobility during ADLs: Minimal assistance;Rolling walker General ADL Comments: Initiated education concerning compensatory strategies for LB ADL.      Vision Baseline Vision/History: No visual deficits Vision Assessment?: No apparent visual deficits     Perception     Praxis      Pertinent Vitals/Pain Pain Assessment: Faces Faces Pain Scale: Hurts whole lot Pain Location: R knee  Pain Descriptors / Indicators: Aching;Operative site guarding Pain Intervention(s): Monitored during session;Repositioned     Hand Dominance     Extremity/Trunk Assessment Upper Extremity Assessment Upper Extremity Assessment: Overall WFL for tasks assessed   Lower Extremity Assessment Lower Extremity Assessment: RLE deficits/detail RLE Deficits / Details: Decreased strength and ROM as expected post-operatively. Increased pain as compared  with previous surgeries per pt.        Communication Communication Communication: No difficulties   Cognition  Arousal/Alertness: Awake/alert Behavior During Therapy: WFL for tasks assessed/performed Overall Cognitive Status: No family/caregiver present to determine baseline cognitive functioning Area of Impairment: Attention;Orientation;Awareness                 Orientation Level: Disoriented to;Time Current Attention Level: Selective       Awareness: Emergent   General Comments: Pt thinking that it was evening rather than morning and noted some decreased awareness at times. Nurse tech in room and answered contact phone stating that she was in room 7 and pt stated "I've never heard of a room 7."   General Comments  Pt with noted drainage on medial side of knee dressing. Nurse tech present during session.     Exercises     Shoulder Instructions      Home Living Family/patient expects to be discharged to:: Private residence Living Arrangements: Spouse/significant other Available Help at Discharge: Family;Available 24 hours/day Type of Home: House Home Access: Stairs to enter [employer] of Steps: 4 Entrance Stairs-Rails: Right;Left;Can reach both Home Layout: Two level;Able to live on main level with bedroom/bathroom     Bathroom Shower/Tub: Occupational psychologist: Standard     Home Equipment: Environmental consultant - 2 wheels;Shower seat;Electric scooter;Wheelchair - manual;Toilet riser          Prior Functioning/Environment Level of Independence: Independent with assistive device(s)        Comments: Reports using RW for mobility.         OT Problem List: Decreased strength;Decreased range of motion;Decreased activity tolerance;Impaired balance (sitting and/or standing);Decreased safety awareness;Decreased knowledge of use of DME or AE;Decreased knowledge of precautions;Pain      OT Treatment/Interventions: Self-care/ADL training;Therapeutic exercise;Energy conservation;DME and/or AE instruction;Therapeutic activities;Patient/family education;Balance training     OT Goals(Current goals can be found in the care plan section) Acute Rehab OT Goals Patient Stated Goal: to go home  OT Goal Formulation: With patient Time For Goal Achievement: 08/17/17 Potential to Achieve Goals: Fair ADL Goals Pt Will Perform Grooming: with modified independence;standing Pt Will Perform Lower Body Bathing: with supervision;sit to/from stand Pt Will Perform Lower Body Dressing: with supervision;sit to/from stand Pt Will Transfer to Toilet: with supervision;ambulating;regular height toilet Pt Will Perform Toileting - Clothing Manipulation and hygiene: with supervision;sit to/from stand Pt Will Perform Tub/Shower Transfer: Shower transfer;with supervision;shower seat;ambulating;rolling walker  OT Frequency: Min 2X/week   Barriers to D/C:            Co-evaluation              AM-PAC PT "6 Clicks" Daily Activity     Outcome Measure Help from another person eating meals?: None Help from another person taking care of personal grooming?: None Help from another person toileting, which includes using toliet, bedpan, or urinal?: A Lot Help from another person bathing (including washing, rinsing, drying)?: A Lot Help from another person to put on and taking off regular upper body clothing?: None Help from another person to put on and taking off regular lower body clothing?: A Lot 6 Click Score: 18   End of Session Equipment Utilized During Treatment: Rolling walker Nurse Communication: Mobility status  Activity Tolerance: Patient tolerated treatment well Patient left: in bed;with call bell/phone within reach;with nursing/sitter in room;Other (comment)(seated at [facility] with nurse tech setting up for bath)  OT Visit Diagnosis: Other abnormalities of gait and mobility (R26.89);Pain Pain -  Right/Left: Right Pain - part of body: Knee                Time: 4458-4835 OT Time Calculation (min): 13 min Charges:  OT General Charges $OT Visit: 1 Visit OT Evaluation $OT  Eval Moderate Complexity: 1 Mod G-Codes:     Norman Herrlich, MS OTR/L  Pager: [place] A Lorian Yaun 08/03/2017, 12:15 PM

## 2017-08-03 NOTE — Progress Notes (Signed)
Physical Therapy Treatment Patient Details Name: Lisa Duke MRN: [id number] DOB: 01/07/1946 Today's Date: 08/03/2017    History of Present Illness Pt is a 72 y/o female s/p elective R total knee revision. PMH includes RA, fibromyalgia, and R TKA.     PT Comments    Pt making great progress with functional mobility since previous session this morning. Pt tolerated ambulating in hallway ~100' with RW and min guard for safety. She remains limited secondary to pain and weakness. Pt will need stair training prior to d/c home. Pt would continue to benefit from skilled physical therapy services at this time while admitted and after d/c to address the below listed limitations in order to improve overall safety and independence with functional mobility.    Follow Up Recommendations  Home health PT;Supervision/Assistance - 24 hour     Equipment Recommendations  None recommended by PT    Recommendations for Other Services       Precautions / Restrictions Precautions Precautions: Knee Restrictions Weight Bearing Restrictions: Yes RLE Weight Bearing: Weight bearing as tolerated    Mobility  Bed Mobility Overal bed mobility: Needs Assistance Bed Mobility: Supine to Sit;Sit to Supine     Supine to sit: Min guard Sit to supine: Min guard   General bed mobility comments: increased time and effort, min guard for safety  Transfers Overall transfer level: Needs assistance Equipment used: Rolling walker (2 wheeled) Transfers: Sit to/from Stand Sit to Stand: Min guard         General transfer comment: increased time and effort, good technique, min guard for safety  Ambulation/Gait Ambulation/Gait assistance: Min guard Gait Distance (Feet): 100 Feet Assistive device: Rolling walker (2 wheeled) Gait Pattern/deviations: Step-to pattern;Decreased step length - right;Decreased step length - left;Decreased stride length;Decreased weight shift to right Gait velocity:  decreased Gait velocity interpretation: <1.31 ft/sec, indicative of household ambulator General Gait Details: pt with slow, cautious, steady gait with RW and min guard.    Stairs             Wheelchair Mobility    Modified Rankin (Stroke Patients Only)       Balance Overall balance assessment: Needs assistance Sitting-balance support: No upper extremity supported;Feet supported Sitting balance-Leahy Scale: Good     Standing balance support: Bilateral upper extremity supported;During functional activity Standing balance-Leahy Scale: Poor                              Cognition Arousal/Alertness: Awake/alert Behavior During Therapy: WFL for tasks assessed/performed Overall Cognitive Status: Within Functional Limits for tasks assessed                                        Exercises      General Comments        Pertinent Vitals/Pain Pain Assessment: Faces Faces Pain Scale: Hurts little more Pain Location: R knee  Pain Descriptors / Indicators: Aching;Operative site guarding Pain Intervention(s): Monitored during session;Repositioned    Home Living                      Prior Function            PT Goals (current goals can now be found in the care plan section) Acute Rehab PT Goals Patient Stated Goal: to go home  PT Goal Formulation: With patient Time  For Goal Achievement: 08/16/17 Potential to Achieve Goals: Good Progress towards PT goals: Progressing toward goals    Frequency    7X/week      PT Plan Discharge plan needs to be updated    Co-evaluation              AM-PAC PT "6 Clicks" Daily Activity  Outcome Measure  Difficulty turning over in bed (including adjusting bedclothes, sheets and blankets)?: A Little Difficulty moving from lying on back to sitting on the side of the bed? : A Little Difficulty sitting down on and standing up from a chair with arms (e.g., wheelchair, bedside commode,  etc,.)?: Unable Help needed moving to and from a bed to chair (including a wheelchair)?: A Little Help needed walking in hospital room?: A Little Help needed climbing 3-5 steps with a railing? : A Little 6 Click Score: 16    End of Session Equipment Utilized During Treatment: Gait belt Activity Tolerance: Patient limited by pain;Patient limited by fatigue Patient left: in bed;with call bell/phone within reach;with bed alarm set;with SCD's reapplied Nurse Communication: Mobility status PT Visit Diagnosis: Unsteadiness on feet (R26.81);Other abnormalities of gait and mobility (R26.89);Pain Pain - Right/Left: Right Pain - part of body: Knee     Time: 3202-3343 PT Time Calculation (min) (ACUTE ONLY): 27 min  Charges:  $Gait Training: 23-37 mins                    G Codes:       [place], [place], [place] 08/03/2017, 5:13 PM

## 2017-08-04 LAB — URINALYSIS, ROUTINE W REFLEX MICROSCOPIC
Bacteria, UA: NONE SEEN
Bilirubin Urine: NEGATIVE
Glucose, UA: NEGATIVE mg/dL
KETONES UR: NEGATIVE mg/dL
LEUKOCYTES UA: NEGATIVE
Nitrite: NEGATIVE
PROTEIN: NEGATIVE mg/dL
Specific Gravity, Urine: 1.01 (ref 1.005–1.030)
pH: 6 (ref 5.0–8.0)

## 2017-08-04 LAB — CBC
HEMATOCRIT: 37.6 % (ref 36.0–46.0)
HEMOGLOBIN: 12.4 g/dL (ref 12.0–15.0)
MCH: 30.5 pg (ref 26.0–34.0)
MCHC: 33 g/dL (ref 30.0–36.0)
MCV: 92.6 fL (ref 78.0–100.0)
Platelets: 230 10*3/uL (ref 150–400)
RBC: 4.06 MIL/uL (ref 3.87–5.11)
RDW: 14.3 % (ref 11.5–15.5)
WBC: 9.9 10*3/uL (ref 4.0–10.5)

## 2017-08-04 MED ORDER — BETHANECHOL CHLORIDE 25 MG PO TABS
25.0000 mg | ORAL_TABLET | Freq: Four times a day (QID) | ORAL | Status: DC
  Administered 2017-08-04 – 2017-08-05 (×5): 25 mg via ORAL
  Filled 2017-08-04 (×5): qty 1

## 2017-08-04 NOTE — Progress Notes (Signed)
Patient is pleasantly confused. Spoke with patients husband and he agrees. Talked with PA of Dr. Shaune Spittle. UA and urine culture completed. In and out cath completed, 600 cc collected. Patient tolerated well. Instructed patient on only drink water at this time, and no more coke. Patient verbalized understanding. Will continue to monitor patient.

## 2017-08-04 NOTE — Progress Notes (Addendum)
Patient had in and out cath at 0500, with 600cc output. Patient has not urinated on own since foley removed, 2 in and out caths performed. PA for Dr. Shaune Spittle office has been contacted. Patient is acting confused at this time. Awaiting response and interventions. Will continue to monitor patient.  Bladder scanned done at 1058, 520mL in bladder

## 2017-08-04 NOTE — Care Management (Signed)
After review, Cory with [facility] states that patient cannot be changed from [facility] to [place] due to being part of the ortho bundle.  Pt will go home with [facility] as planned.  Katina with [facility] notified of patient dc.  Pt is aware.

## 2017-08-04 NOTE — Progress Notes (Addendum)
Subjective: 2 Days Post-Op Procedure(s) (LRB): RIGHT TOTAL KNEE REVISION (Right) Patient reports pain as moderate.  Progressing with physical therapy.  Wants to go home today.  Taking by mouth OK.  Objective: Vital signs in last 24 hours: Temp:  [98 F (36.7 C)-98.1 F (36.7 C)] 98 F (36.7 C) (06/23 0346) Pulse Rate:  [86-106] 88 (06/23 0346) Resp:  [15-16] 16 (06/23 0346) BP: (131-142)/(45-68) 142/68 (06/23 0346) SpO2:  [93 %-97 %] 96 % (06/23 0346)  Intake/Output from previous day: 06/22 0701 - 06/23 0700 In: 1497.8 [P.O.:720; I.V.:777.8] Out: 5000 [Urine:5000] Intake/Output this shift: No intake/output data recorded.  Recent Labs    08/03/17 0337 08/04/17 0354  HGB 12.1 12.4   Recent Labs    08/03/17 0337 08/04/17 0354  WBC 10.9* 9.9  RBC 4.01 4.06  HCT 38.2 37.6  PLT 224 230   Recent Labs    08/03/17 0337  NA 133*  K 4.1  CL 101  CO2 27  BUN 6  CREATININE 0.68  GLUCOSE 122*  CALCIUM 8.2*   No results for input(s): LABPT, INR in the last 72 hours. Right knee exam: Neurovascular intact Sensation intact distally Intact pulses distally Dorsiflexion/Plantar flexion intact Incision: dressing C/D/I Compartment soft  Anticipated LOS equal to or greater than 2 midnights due to - Age 14 and older with one or more of the following:  - Obesity  - Expected need for hospital services (PT, OT, Nursing) required for safe  discharge  - Anticipated need for postoperative skilled nursing care or inpatient rehab  - Active co-morbidities: None OR   - Unanticipated findings during/Post Surgery: Slow post-op progression: GI, pain control, mobility  - Patient is a high risk of re-admission due to: None   Assessment/Plan: 2 Days Post-Op Procedure(s) (LRB): RIGHT TOTAL KNEE REVISION (Right)  Plan: Aspirin 81 mg per protocol for DVT prophylaxis. Weight-bear as tolerated on right. Discharge home with home health  Follow-up with Dr. Mayer Camel in 10 to 14  days.    Erlene Senters 08/04/2017, 9:28 AM

## 2017-08-04 NOTE — Discharge Summary (Signed)
Patient ID: Lisa Duke MRN: [id number] DOB/AGE: March 13, 1945 72 y.o.  Admit date: 08/02/2017 Discharge date: 08/04/2017  Admission Diagnoses:  Principal Problem:   Loose right total knee arthroplasty [facility]) Active Problems:   Primary osteoarthritis of right knee   Discharge Diagnoses:  Same  Past Medical History:  Diagnosis Date  . Anxiety   . Arthritis 2010   Rhematoid  . Asthma    doesnt use inhalers often  . Back ache    arthritis  . Depression   . Fibromyalgia   . GERD (gastroesophageal reflux disease)   . Headache(784.0)    Migraines  . Hypothyroidism   . Incontinence of urine     Surgeries: Procedure(s): RIGHT TOTAL KNEE REVISION on 08/02/2017   Consultants:   Discharged Condition: Improved  Hospital Course: Lisa Duke is an 72 y.o. female who was admitted 08/02/2017 for operative treatment ofLoose right total knee arthroplasty ([place]). Patient has severe unremitting pain that affects sleep, daily activities, and work/hobbies. After pre-op clearance the patient was taken to the operating room on 08/02/2017 and underwent  Procedure(s): RIGHT TOTAL KNEE REVISION.    Patient was given perioperative antibiotics:  Anti-infectives (From admission, onward)   Start     Dose/Rate Route Frequency Ordered Stop   08/02/17 0943  cefUROXime (ZINACEF) injection  Status:  Discontinued       As needed 08/02/17 0943 08/02/17 1011   08/02/17 0600  ceFAZolin (ANCEF) IVPB 2g/100 mL premix     2 g 200 mL/hr over 30 Minutes Intravenous On call to O.R. 08/02/17 0540 08/02/17 0746   08/02/17 0544  ceFAZolin (ANCEF) 2-4 GM/100ML-% IVPB    Note to Pharmacy:  Beverly Gust   : cabinet override      08/02/17 0544 08/02/17 0746       Patient was given sequential compression devices, early ambulation, and chemoprophylaxis to prevent DVT.  Patient benefited maximally from hospital stay and there were no complications.    Recent vital signs:  Patient Vitals for the past 24  hrs:  BP Temp Temp src Pulse Resp SpO2  08/04/17 0346 (!) 142/68 98 F (36.7 C) Oral 88 16 96 %  08/04/17 0000 131/60 98.1 F (36.7 C) Oral 86 15 93 %  08/03/17 2000 (!) 141/67 98.1 F (36.7 C) Oral (!) 105 16 97 %  08/03/17 1511 (!) 138/45 - - (!) 106 - 93 %     Recent laboratory studies:  Recent Labs    08/03/17 0337 08/04/17 0354  WBC 10.9* 9.9  HGB 12.1 12.4  HCT 38.2 37.6  PLT 224 230  NA 133*  --   K 4.1  --   CL 101  --   CO2 27  --   BUN 6  --   CREATININE 0.68  --   GLUCOSE 122*  --   CALCIUM 8.2*  --      Discharge Medications:   Allergies as of 08/04/2017      Reactions   Sulfa Antibiotics Itching      Medication List    STOP taking these medications   HYDROcodone-acetaminophen 10-325 MG tablet Commonly known as:  NORCO   traMADol 50 MG tablet Commonly known as:  ULTRAM     TAKE these medications   acebutolol 400 MG capsule Commonly known as:  SECTRAL Take 400 mg by mouth at bedtime.   aspirin EC 81 MG tablet Take 1 tablet (81 mg total) by mouth 2 (two) times daily. What changed:  when  to take this   betamethasone dipropionate 0.05 % cream Commonly known as:  DIPROLENE Apply 1 application topically 2 (two) times daily as needed. For skin irritation/rash   budesonide 180 MCG/ACT inhaler Commonly known as:  PULMICORT Inhale 2 puffs into the lungs as needed (asthma).   clonazePAM 2 MG tablet Commonly known as:  KLONOPIN Take 2 mg by mouth 3 (three) times daily.   DENTAGEL 1.1 % Gel dental gel Generic drug:  sodium fluoride Place 1 application onto teeth at bedtime.   DULoxetine 60 MG capsule Commonly known as:  CYMBALTA Take 60 mg by mouth 2 (two) times daily.   folic acid 1 MG tablet Commonly known as:  FOLVITE Take 3 mg by mouth daily.   methotrexate (PF) 50 MG/2ML injection Inject 25 mg into the muscle every Sunday.   nortriptyline 50 MG capsule Commonly known as:  PAMELOR Take 50 mg by mouth at bedtime.    oxyCODONE-acetaminophen 5-325 MG tablet Commonly known as:  PERCOCET/ROXICET Take 1 tablet by mouth every 4 (four) hours as needed for severe pain.   REMICADE IV Inject 1 Dose into the vein every 6 (six) weeks.   SYNTHROID 112 MCG tablet Generic drug:  levothyroxine Take 112 mcg by mouth daily before breakfast.   SYSTANE 0.4-0.3 % Soln Generic drug:  Polyethyl Glycol-Propyl Glycol Place 1 drop into both eyes 3 (three) times daily as needed (for dry/irritated eyes.).   tiZANidine 2 MG tablet Commonly known as:  ZANAFLEX Take 1 tablet (2 mg total) by mouth every 6 (six) hours as needed.   tolterodine 4 MG 24 hr capsule Commonly known as:  DETROL LA Take 4 mg by mouth every morning.            Durable Medical Equipment  (From admission, onward)        Start     Ordered   08/02/17 1423  DME Walker rolling  Once    Question:  Patient needs a walker to treat with the following condition  Answer:  Status post right knee replacement   08/02/17 1422   08/02/17 1423  DME 3 n 1  Once     08/02/17 1422       Discharge Care Instructions  (From admission, onward)        Start     Ordered   08/04/17 0000  Weight bearing as tolerated    Question Answer Comment  Laterality right   Extremity Lower      06 /23/19 0932      Diagnostic Studies: Dg Chest 2 View  Result Date: 07/23/2017 CLINICAL DATA:  Preop knee surgery. EXAM: CHEST - 2 VIEW COMPARISON:  11/12/2013 FINDINGS: Nodular and linear densities noted in the left upper lobe. This may reflect scarring, but recommend further evaluation with chest CT. This is new since 2015. Right lung clear. Linear scarring at the right lung base. Heart is normal size. No effusions. Moderate compression fracture in the lower thoracic spine, new since 2015. IMPRESSION: Nodular and linear airspace densities in the left upper lobe. This could reflect pneumonia or scarring. This could be further evaluated with chest CT. Moderate compression  fracture in the lower thoracic spine, age indeterminate but new since 2015. Electronically Signed   By: Rolm Baptise M.D.   On: 07/23/2017 08:20   Dg Knee 1-2 Views Right  Result Date: 08/02/2017 CLINICAL DATA:  h status post RIGHT total knee revision. EXAM: RIGHT KNEE - 1-2 VIEW COMPARISON:  None FINDINGS: Patient has  RIGHT total knee arthroplasty. Gas is identified within the joint space. Prostheses appear intact and normally aligned. Lucency surrounding the tibial prosthetic shaft likely represents site of prior prosthesis. IMPRESSION: Status post total knee revision.  No adverse features. Electronically Signed   By: Nolon Nations M.D.   On: 08/02/2017 11:02    Disposition: Discharge disposition: 01-Home or Self Care       Discharge Instructions    Call MD / Call 911   Complete by:  As directed    If you experience chest pain or shortness of breath, CALL 911 and be transported to the hospital emergency room.  If you develope a fever above 101 F, pus (white drainage) or increased drainage or redness at the wound, or calf pain, call your surgeon's office.   Diet general   Complete by:  As directed    Increase activity slowly as tolerated   Complete by:  As directed    Weight bearing as tolerated   Complete by:  As directed    Laterality:  right   Extremity:  Lower      Follow-up Information    Frederik Pear, MD In 2 weeks.   Specialty:  Orthopedic Surgery Contact information: [place] [address] [phone]            Signed: Erlene Senters 08/04/2017, 9:32 AM

## 2017-08-04 NOTE — Progress Notes (Signed)
Occupational Therapy Treatment Patient Details Name: Lisa Duke MRN: [id number] DOB: October 11, 1945 Today's Date: 08/04/2017    History of present illness Pt is a 72 y/o female s/p elective R total knee revision. PMH includes RA, fibromyalgia, and R TKA.    OT comments  Pt. Making progress with acute OT goals.  Able to complete bed mobility with min a, and amb. To/from b.room for toileting tasks.  Reports husband available to assist with LB ADLs.  Reports she still feels a "little groggy".  Will attempt tub/shower transfer at next session as pt. Able.    Follow Up Recommendations  No OT follow up;Supervision/Assistance - 24 hour    Equipment Recommendations  None recommended by OT    Recommendations for Other Services      Precautions / Restrictions Precautions Precautions: Knee Restrictions RLE Weight Bearing: Weight bearing as tolerated       Mobility Bed Mobility Overal bed mobility: Needs Assistance Bed Mobility: Supine to Sit     Supine to sit: Min assist     General bed mobility comments: pt. reports her bed at home appears semi elevated due to stacking 3 pillows, simulated hob to mimic the approx. height of her bed at home, no rails used, pt. exits on L side of bed.  able to bring trunk upright and prop on elbows but did require assistance guiding R LE OOB.  states her husband will be able to provide the same assistance at home  Transfers Overall transfer level: Needs assistance Equipment used: Rolling walker (2 wheeled) Transfers: Sit to/from [facility] Sit to Stand: Min guard Stand pivot transfers: Min assist       General transfer comment: increased time and effort, good technique, min guard for safety, mod cues for RW management during ambulation to/from b.room     Balance                                           ADL either performed or assessed with clinical judgement   ADL Overall ADL's : Needs  assistance/impaired               Lower Body Bathing Details (indicate cue type and reason): reports she has had multiple sx in the past and is familiar with tech. for ADL completion, also reports husband available to assist with LB ADLS as needed, declines need for practice or A/E       Lower Body Dressing Details (indicate cue type and reason): reports she has had multiple sx in the past and is familiar with tech. for ADL completion, also reports husband available to assist with LB ADLS as needed, declines need for practice or A/E Toilet Transfer: Minimal assistance;Ambulation;RW Toilet Transfer Details (indicate cue type and reason): cues for RW management, cont. to have RW too far in front of her with cues to step inside the rw during ambulation Toileting- Water quality scientist and Hygiene: Min guard;Sit to/from stand       Functional mobility during ADLs: Minimal assistance;Rolling walker General ADL Comments: pt. reports feeling very groggy, states she is "not a morning person and slow to wake up" will atempt tub/shower tranfer at next session     Vision       Perception     Praxis      Cognition Arousal/Alertness: Lethargic Behavior During Therapy: WFL for tasks assessed/performed Overall Cognitive Status: Within  Functional Limits for tasks assessed                                 General Comments: reports "im just groggy but im ok"        Exercises     Shoulder Instructions       General Comments      Pertinent Vitals/ Pain       Pain Assessment: No/denies pain  Home Living                                          Prior Functioning/Environment              Frequency  Min 2X/week        Progress Toward Goals  OT Goals(current goals can now be found in the care plan section)  Progress towards OT goals: Progressing toward goals     Plan      Co-evaluation                 AM-PAC PT "6 Clicks"  Daily Activity     Outcome Measure   Help from another person eating meals?: None Help from another person taking care of personal grooming?: None Help from another person toileting, which includes using toliet, bedpan, or urinal?: A Lot Help from another person bathing (including washing, rinsing, drying)?: A Lot Help from another person to put on and taking off regular upper body clothing?: None Help from another person to put on and taking off regular lower body clothing?: A Lot 6 Click Score: 18    End of Session Equipment Utilized During Treatment: Gait belt;Rolling walker  OT Visit Diagnosis: Other abnormalities of gait and mobility (R26.89);Pain Pain - Right/Left: Right Pain - part of body: Knee   Activity Tolerance Patient tolerated treatment well   Patient Left in chair;with call bell/phone within reach   Nurse Communication          Time: 6644-0347 OT Time Calculation (min): 17 min  Charges: OT General Charges $OT Visit: 1 Visit OT Treatments $Self Care/Home Management : 8-22 mins  Janice Coffin, COTA/L 08/04/2017, 8:30 AM

## 2017-08-04 NOTE — Progress Notes (Signed)
Bladder scan 660 mL. Called PA, PA stated to go ahead and place a foley catheter for the night, remove at 6 am.

## 2017-08-04 NOTE — Progress Notes (Signed)
Physical Therapy Treatment Patient Details Name: Lisa Duke MRN: [id number] DOB: Jan 16, 1946 Today's Date: 08/04/2017    History of Present Illness Pt is a 72 y/o female s/p elective R total knee revision. PMH includes RA, fibromyalgia, and R TKA.     PT Comments    Patient slightly more alert this session but still continues with significant lethargy and confusion. Ambulating 60 feet with walker with slow pace and an antalgic gait pattern, relying heavily on walker use. Rest of session focused on reviewing supine HEP for strengthening/range of motion. Stairs not safe to attempt this session; will re-attempt tomorrow if patient is more alert.    Follow Up Recommendations  Supervision/Assistance - 24 hour;Follow surgeon's recommendation for DC plan and follow-up therapies     Equipment Recommendations  None recommended by PT    Recommendations for Other Services       Precautions / Restrictions Precautions Precautions: Knee Restrictions Weight Bearing Restrictions: Yes RLE Weight Bearing: Weight bearing as tolerated    Mobility  Bed Mobility Overal bed mobility: Needs Assistance Bed Mobility: Supine to Sit       Sit to supine: Min guard      Transfers Overall transfer level: Needs assistance Equipment used: Rolling walker (2 wheeled) Transfers: Sit to/from Stand Sit to Stand: Min guard         General transfer comment: increased time and effort  Ambulation/Gait Ambulation/Gait assistance: Min guard Gait Distance (Feet): 60 Feet Assistive device: Rolling walker (2 wheeled) Gait Pattern/deviations: Step-to pattern;Decreased step length - right;Decreased step length - left;Decreased stride length;Decreased weight shift to right Gait velocity: decreased Gait velocity interpretation: <1.31 ft/sec, indicative of household ambulator General Gait Details: pt with slow, cautious, steady gait with RW. Heavy reliance through BUE and forward trunk lean. Max cues  for increased left foot step length/clearance as well as upright posture.    Stairs             Wheelchair Mobility    Modified Rankin (Stroke Patients Only)       Balance Overall balance assessment: Needs assistance Sitting-balance support: No upper extremity supported;Feet supported Sitting balance-Leahy Scale: Good     Standing balance support: Bilateral upper extremity supported;During functional activity Standing balance-Leahy Scale: Poor                              Cognition Arousal/Alertness: Lethargic Behavior During Therapy: WFL for tasks assessed/performed Overall Cognitive Status: Impaired/Different from baseline Area of Impairment: Attention;Awareness                   Current Attention Level: Selective       Awareness: Emergent   General Comments: Slightly more alert this session but still confused and lethargic      Exercises Total Joint Exercises Quad Sets: AROM;Right;10 reps Heel Slides: 10 reps;AAROM;Right Straight Leg Raises: 10 reps;AROM;Right Goniometric ROM: Seated: 0-86 degrees    General Comments        Pertinent Vitals/Pain Pain Assessment: Faces Faces Pain Scale: Hurts little more Pain Location: R knee  Pain Descriptors / Indicators: Aching;Operative site guarding Pain Intervention(s): Monitored during session    Home Living                      Prior Function            PT Goals (current goals can now be found in the care plan section) Acute Rehab  PT Goals Patient Stated Goal: to go home  PT Goal Formulation: With patient Time For Goal Achievement: 08/16/17 Potential to Achieve Goals: Good    Frequency    7X/week      PT Plan Current plan remains appropriate    Co-evaluation              AM-PAC PT "6 Clicks" Daily Activity  Outcome Measure  Difficulty turning over in bed (including adjusting bedclothes, sheets and blankets)?: A Little Difficulty moving from lying on  back to sitting on the side of the bed? : Unable Difficulty sitting down on and standing up from a chair with arms (e.g., wheelchair, bedside commode, etc,.)?: A Little Help needed moving to and from a bed to chair (including a wheelchair)?: A Little Help needed walking in hospital room?: A Little Help needed climbing 3-5 steps with a railing? : A Lot 6 Click Score: 15    End of Session Equipment Utilized During Treatment: Gait belt Activity Tolerance: Patient limited by fatigue Patient left: with call bell/phone within reach;in chair Nurse Communication: Mobility status PT Visit Diagnosis: Unsteadiness on feet (R26.81);Other abnormalities of gait and mobility (R26.89);Pain Pain - Right/Left: Right Pain - part of body: Knee     Time: 6433-2951 PT Time Calculation (min) (ACUTE ONLY): 18 min  Charges:  $Gait Training: 8-22 mins                    G Codes:       Ellamae Sia, PT, DPT Acute Rehabilitation Services  Pager: [phone]    Willy Eddy 08/04/2017, 5:19 PM

## 2017-08-04 NOTE — Clinical Social Work Note (Signed)
Pt plans to return home with husband and 2 friends. Clinical Social Worker will sign off for now as social work intervention is no longer needed. Please consult [place] again if new need arises.   Shelton Silvas A Lanique Gonzalo 08/04/2017

## 2017-08-04 NOTE — Progress Notes (Signed)
Physical Therapy Treatment Patient Details Name: Lisa Duke MRN: [id number] DOB: Dec 09, 1945 Today's Date: 08/04/2017    History of Present Illness Pt is a 72 y/o female s/p elective R total knee revision. PMH includes RA, fibromyalgia, and R TKA.     PT Comments    Did not feel that steps were safe to attempt this session due to patient's decreased level of alertness and attention. Ambulation limited to 40 feet with walker and min guard secondary to fatigue. Rest of session focused on bed level exercises. Will follow.    Follow Up Recommendations  Supervision/Assistance - 24 hour;Follow surgeon's recommendation for DC plan and follow-up therapies     Equipment Recommendations  None recommended by PT    Recommendations for Other Services       Precautions / Restrictions Precautions Precautions: Knee Restrictions Weight Bearing Restrictions: Yes RLE Weight Bearing: Weight bearing as tolerated    Mobility  Bed Mobility Overal bed mobility: Needs Assistance Bed Mobility: Supine to Sit;Sit to Supine     Supine to sit: Min assist Sit to supine: Min guard   General bed mobility comments: min assist for LLE management out of bed  Transfers Overall transfer level: Needs assistance Equipment used: Rolling walker (2 wheeled) Transfers: Sit to/from Stand Sit to Stand: Min guard         General transfer comment: increased time and effort, good technique, min guard for safety  Ambulation/Gait Ambulation/Gait assistance: Min guard Gait Distance (Feet): 40 Feet Assistive device: Rolling walker (2 wheeled) Gait Pattern/deviations: Step-to pattern;Decreased step length - right;Decreased step length - left;Decreased stride length;Decreased weight shift to right Gait velocity: decreased Gait velocity interpretation: <1.31 ft/sec, indicative of household ambulator General Gait Details: pt with slow, cautious, steady gait with RW. Max cues for increased left foot step  length/clearance as well as upright posture.    Stairs             Wheelchair Mobility    Modified Rankin (Stroke Patients Only)       Balance Overall balance assessment: Needs assistance Sitting-balance support: No upper extremity supported;Feet supported Sitting balance-Leahy Scale: Good     Standing balance support: Bilateral upper extremity supported;During functional activity Standing balance-Leahy Scale: Poor                              Cognition Arousal/Alertness: Lethargic Behavior During Therapy: WFL for tasks assessed/performed Overall Cognitive Status: Impaired/Different from baseline Area of Impairment: Attention;Awareness                   Current Attention Level: Selective       Awareness: Emergent   General Comments: Decreased attention span and short term memory; had to repeat plan several times. Slurring speech. RN aware      Exercises Total Joint Exercises Quad Sets: AROM;Right;10 reps Short Arc Quad: AROM;10 reps;Supine;Right Long Arc Quad: AAROM;Right;10 reps    General Comments        Pertinent Vitals/Pain Pain Assessment: Faces Faces Pain Scale: Hurts little more Pain Location: R knee  Pain Descriptors / Indicators: Aching;Operative site guarding Pain Intervention(s): Monitored during session    Home Living                      Prior Function            PT Goals (current goals can now be found in the care plan section) Acute Rehab  PT Goals Patient Stated Goal: to go home  PT Goal Formulation: With patient Time For Goal Achievement: 08/16/17 Potential to Achieve Goals: Good    Frequency    7X/week      PT Plan      Co-evaluation              AM-PAC PT "6 Clicks" Daily Activity  Outcome Measure  Difficulty turning over in bed (including adjusting bedclothes, sheets and blankets)?: A Little Difficulty moving from lying on back to sitting on the side of the bed? :  Unable Difficulty sitting down on and standing up from a chair with arms (e.g., wheelchair, bedside commode, etc,.)?: A Little Help needed moving to and from a bed to chair (including a wheelchair)?: A Little Help needed walking in hospital room?: A Little Help needed climbing 3-5 steps with a railing? : A Lot 6 Click Score: 15    End of Session Equipment Utilized During Treatment: Gait belt Activity Tolerance: Patient limited by fatigue Patient left: in bed;with call bell/phone within reach;with bed alarm set Nurse Communication: Mobility status PT Visit Diagnosis: Unsteadiness on feet (R26.81);Other abnormalities of gait and mobility (R26.89);Pain Pain - Right/Left: Right Pain - part of body: Knee     Time: 1110-1134 PT Time Calculation (min) (ACUTE ONLY): 24 min  Charges:  $Gait Training: 8-22 mins $Therapeutic Exercise: 8-22 mins                    G Codes:       Ellamae Sia, PT, DPT Acute Rehabilitation Services  Pager: [phone]    Willy Eddy 08/04/2017, 12:48 PM

## 2017-08-05 ENCOUNTER — Encounter (HOSPITAL_COMMUNITY): Payer: Self-pay | Admitting: Orthopedic Surgery

## 2017-08-05 LAB — CBC
HCT: 37.5 % (ref 36.0–46.0)
HEMOGLOBIN: 12.2 g/dL (ref 12.0–15.0)
MCH: 30.5 pg (ref 26.0–34.0)
MCHC: 32.5 g/dL (ref 30.0–36.0)
MCV: 93.8 fL (ref 78.0–100.0)
Platelets: 247 10*3/uL (ref 150–400)
RBC: 4 MIL/uL (ref 3.87–5.11)
RDW: 14.7 % (ref 11.5–15.5)
WBC: 7.1 10*3/uL (ref 4.0–10.5)

## 2017-08-05 LAB — URINE CULTURE: Culture: NO GROWTH

## 2017-08-05 NOTE — Progress Notes (Signed)
Provided discharge education/instructions, all questions and concerns addressed, Pt not in distress, discharged home with belongings accompanied by husband. 

## 2017-08-05 NOTE — Progress Notes (Signed)
Physical Therapy Treatment Patient Details Name: Lisa Duke MRN: [id number] DOB: [birth year] Today's Date: 08/05/2017    History of Present Illness Pt is a 72 y/o female s/p elective R total knee revision. PMH includes RA, fibromyalgia, and R TKA.     PT Comments    Pt performed gait and progression to stair training this pm.  Pt tolerated supine and seated exercises and re-educated on technique and frequency.  From a mobility standpoint patient is ready to return home with support from spouse.      Follow Up Recommendations  Supervision/Assistance - 24 hour;Follow surgeon's recommendation for DC plan and follow-up therapies     Equipment Recommendations  None recommended by PT    Recommendations for Other Services       Precautions / Restrictions Precautions Precautions: Knee Precaution Booklet Issued: No Precaution Comments: Reviewed knee precautions with pt, however, will need further review.  Restrictions Weight Bearing Restrictions: Yes RLE Weight Bearing: Weight bearing as tolerated    Mobility  Bed Mobility Overal bed mobility: Needs Assistance Bed Mobility: Supine to Sit     Supine to sit: Supervision Sit to supine: Supervision   General bed mobility comments: simulate home enviornement with HOB at 0 degrees, no rails, exiting and entering towards R side   Transfers Overall transfer level: Needs assistance Equipment used: Rolling walker (2 wheeled) Transfers: Sit to/from Stand Sit to Stand: Supervision Stand pivot transfers: Supervision       General transfer comment: increased time required, supervision for safety using RW   Ambulation/Gait Ambulation/Gait assistance: Min guard Gait Distance (Feet): 200 Feet Assistive device: Rolling walker (2 wheeled) Gait Pattern/deviations: Step-to pattern;Decreased step length - right;Decreased step length - left;Decreased stride length;Decreased weight shift to right;Trunk flexed;Step-through  pattern;Decreased stance time - right Gait velocity: decreased   General Gait Details: pt with slow, cautious, steady gait with RW. Heavy reliance through BUE and forward trunk lean. Max cues for increased left foot step length/clearance as well as upright posture.    Stairs Stairs: Yes Stairs assistance: Min guard Stair Management: Two rails;Forwards Number of Stairs: 4 General stair comments: Cues for sequencing and hand placement on railing.     Wheelchair Mobility    Modified Rankin (Stroke Patients Only)       Balance     Sitting balance-Leahy Scale: Good       Standing balance-Leahy Scale: Poor                              Cognition Arousal/Alertness: Awake/alert Behavior During Therapy: WFL for tasks assessed/performed Overall Cognitive Status: Impaired/Different from baseline Area of Impairment: Attention;Awareness                 Orientation Level: Disoriented to;Time Current Attention Level: Selective       Awareness: Emergent   General Comments: improved cognition today, decreased attention to task requiring redirection      Exercises Total Joint Exercises Ankle Circles/Pumps: AROM;Both;20 reps Quad Sets: AROM;Right;10 reps Towel Squeeze: AROM;Both;10 reps;Supine Short Arc Quad: AROM;10 reps;Supine;Right Heel Slides: 10 reps;AAROM;Right Hip ABduction/ADduction: AROM;Right;10 reps;Supine Straight Leg Raises: 10 reps;AROM;Right;Supine Long Arc Quad: AAROM;Right;10 reps Goniometric ROM: 13-72 degrees.     General Comments        Pertinent Vitals/Pain Pain Assessment: Faces Faces Pain Scale: Hurts a little bit Pain Location: R knee Pain Descriptors / Indicators: Aching;Operative site guarding Pain Intervention(s): Monitored during session;Repositioned    Home Living  Prior Function            PT Goals (current goals can now be found in the care plan section) Acute Rehab PT  Goals Patient Stated Goal: to go home  Potential to Achieve Goals: Good Progress towards PT goals: Progressing toward goals    Frequency    7X/week      PT Plan Current plan remains appropriate    Co-evaluation              AM-PAC PT "6 Clicks" Daily Activity  Outcome Measure  Difficulty turning over in bed (including adjusting bedclothes, sheets and blankets)?: A Little Difficulty moving from lying on back to sitting on the side of the bed? : Unable Difficulty sitting down on and standing up from a chair with arms (e.g., wheelchair, bedside commode, etc,.)?: Unable Help needed moving to and from a bed to chair (including a wheelchair)?: A Little Help needed walking in hospital room?: A Little Help needed climbing 3-5 steps with a railing? : A Lot 6 Click Score: 13    End of Session Equipment Utilized During Treatment: Gait belt Activity Tolerance: Patient limited by fatigue Patient left: with call bell/phone within reach;in chair Nurse Communication: Mobility status PT Visit Diagnosis: Unsteadiness on feet (R26.81);Other abnormalities of gait and mobility (R26.89);Pain Pain - Right/Left: Right Pain - part of body: Knee     Time: 5797-2820 PT Time Calculation (min) (ACUTE ONLY): 48 min  Charges:  $Gait Training: 23-37 mins $Therapeutic Exercise: 8-22 mins                    G Codes:       Governor Rooks, PTA pager [phone]    Cristela Blue 08/05/2017, 3:15 PM

## 2017-08-05 NOTE — Progress Notes (Signed)
PATIENT ID: Lisa Duke  MRN: [id number]  DOB/AGE:  1945-05-16 / 72 y.o.  3 Days Post-Op Procedure(s) (LRB): RIGHT TOTAL KNEE REVISION (Right)    PROGRESS NOTE Subjective: Patient is alert, oriented, no Nausea, no Vomiting, yes passing gas. Taking PO well. Denies SOB, Chest or Calf Pain. Using Incentive Spirometer, PAS in place. Ambulate WBAT with pt walking 60 ft with therapy, Patient reports pain as 5/10 .    Objective: Vital signs in last 24 hours: Vitals:   08/04/17 0000 08/04/17 0346 08/04/17 1420 08/04/17 2000  BP: 131/60 (!) 142/68 102/89 (!) 125/54  Pulse: 86 88 83 90  Resp: 15 16 16 14   Temp: 98.1 F (36.7 C) 98 F (36.7 C) (!) 97.5 F (36.4 C) 98.4 F (36.9 C)  TempSrc: Oral Oral Oral Oral  SpO2: 93% 96% 90% 93%  Weight:      Height:          Intake/Output from previous day: I/O last 3 completed shifts: In: 1120 [P.O.:720; Other:400] Out: 4600 [Urine:4600]   Intake/Output this shift: No intake/output data recorded.   LABORATORY DATA: Recent Labs    08/03/17 0337 08/04/17 0354 08/05/17 0344  WBC 10.9* 9.9 7.1  HGB 12.1 12.4 12.2  HCT 38.2 37.6 37.5  PLT 224 230 247  NA 133*  --   --   K 4.1  --   --   CL 101  --   --   CO2 27  --   --   BUN 6  --   --   CREATININE 0.68  --   --   GLUCOSE 122*  --   --   CALCIUM 8.2*  --   --     Examination: Neurologically intact Neurovascular intact Sensation intact distally Intact pulses distally Dorsiflexion/Plantar flexion intact Incision: dressing C/D/I No cellulitis present Compartment soft}  Assessment:   3 Days Post-Op Procedure(s) (LRB): RIGHT TOTAL KNEE REVISION (Right) ADDITIONAL DIAGNOSIS: Expected Acute Blood Loss Anemia, Urinary Retention and HTN  Plan: PT/OT WBAT, AROM and PROM  DVT Prophylaxis:  SCDx72hrs, ASA 81 mg BID x 2 weeks DISCHARGE PLAN: Home, when pt passes therapy goals DISCHARGE NEEDS: HHPT, Walker and 3-in-1 comode seat U/A negative    Joanell Rising 08/05/2017,  7:12 AM

## 2017-08-05 NOTE — Discharge Summary (Signed)
Patient ID: Lisa Duke MRN: [id number] DOB/AGE: 73-Dec-1947 72 y.o.  Admit date: 08/02/2017 Discharge date: 08/05/2017  Admission Diagnoses:  Principal Problem:   Loose right total knee arthroplasty [facility]) Active Problems:   Primary osteoarthritis of right knee   Discharge Diagnoses:  Same  Past Medical History:  Diagnosis Date  . Anxiety   . Arthritis 2010   Rhematoid  . Asthma    doesnt use inhalers often  . Back ache    arthritis  . Depression   . Fibromyalgia   . GERD (gastroesophageal reflux disease)   . Headache(784.0)    Migraines  . Hypothyroidism   . Incontinence of urine     Surgeries: Procedure(s): RIGHT TOTAL KNEE REVISION on 08/02/2017   Consultants:   Discharged Condition: Improved  Hospital Course: Lisa Duke is an 72 y.o. female who was admitted 08/02/2017 for operative treatment ofLoose right total knee arthroplasty ([place]). Patient has severe unremitting pain that affects sleep, daily activities, and work/hobbies. After pre-op clearance the patient was taken to the operating room on 08/02/2017 and underwent  Procedure(s): RIGHT TOTAL KNEE REVISION.    Patient was given perioperative antibiotics:  Anti-infectives (From admission, onward)   Start     Dose/Rate Route Frequency Ordered Stop   08/02/17 0943  cefUROXime (ZINACEF) injection  Status:  Discontinued       As needed 08/02/17 0943 08/02/17 1011   08/02/17 0600  ceFAZolin (ANCEF) IVPB 2g/100 mL premix     2 g 200 mL/hr over 30 Minutes Intravenous On call to O.R. 08/02/17 0540 08/02/17 0746   08/02/17 0544  ceFAZolin (ANCEF) 2-4 GM/100ML-% IVPB    Note to Pharmacy:  Beverly Gust   : cabinet override      08/02/17 0544 08/02/17 0746       Patient was given sequential compression devices, early ambulation, and chemoprophylaxis to prevent DVT.  Patient benefited maximally from hospital stay and there were no complications.    Recent vital signs:  Patient Vitals for the past 24  hrs:  BP Temp Temp src Pulse Resp SpO2  08/05/17 1238 - - - 79 - 90 %  08/05/17 1238 (!) 110/98 98.2 F (36.8 C) Oral 78 16 -  08/04/17 2000 (!) 125/54 98.4 F (36.9 C) Oral 90 14 93 %  08/04/17 1420 102/89 (!) 97.5 F (36.4 C) Oral 83 16 90 %     Recent laboratory studies:  Recent Labs    08/03/17 0337 08/04/17 0354 08/05/17 0344  WBC 10.9* 9.9 7.1  HGB 12.1 12.4 12.2  HCT 38.2 37.6 37.5  PLT 224 230 247  NA 133*  --   --   K 4.1  --   --   CL 101  --   --   CO2 27  --   --   BUN 6  --   --   CREATININE 0.68  --   --   GLUCOSE 122*  --   --   CALCIUM 8.2*  --   --      Discharge Medications:   Allergies as of 08/05/2017      Reactions   Sulfa Antibiotics Itching      Medication List    STOP taking these medications   HYDROcodone-acetaminophen 10-325 MG tablet Commonly known as:  NORCO   traMADol 50 MG tablet Commonly known as:  ULTRAM     TAKE these medications   acebutolol 400 MG capsule Commonly known as:  SECTRAL Take 400 mg  by mouth at bedtime.   aspirin EC 81 MG tablet Take 1 tablet (81 mg total) by mouth 2 (two) times daily. What changed:  when to take this   betamethasone dipropionate 0.05 % cream Commonly known as:  DIPROLENE Apply 1 application topically 2 (two) times daily as needed. For skin irritation/rash   budesonide 180 MCG/ACT inhaler Commonly known as:  PULMICORT Inhale 2 puffs into the lungs as needed (asthma).   clonazePAM 2 MG tablet Commonly known as:  KLONOPIN Take 2 mg by mouth 3 (three) times daily.   DENTAGEL 1.1 % Gel dental gel Generic drug:  sodium fluoride Place 1 application onto teeth at bedtime.   DULoxetine 60 MG capsule Commonly known as:  CYMBALTA Take 60 mg by mouth 2 (two) times daily.   folic acid 1 MG tablet Commonly known as:  FOLVITE Take 3 mg by mouth daily.   methotrexate (PF) 50 MG/2ML injection Inject 25 mg into the muscle every Sunday.   nortriptyline 50 MG capsule Commonly known as:   PAMELOR Take 50 mg by mouth at bedtime.   oxyCODONE-acetaminophen 5-325 MG tablet Commonly known as:  PERCOCET/ROXICET Take 1 tablet by mouth every 4 (four) hours as needed for severe pain.   REMICADE IV Inject 1 Dose into the vein every 6 (six) weeks.   SYNTHROID 112 MCG tablet Generic drug:  levothyroxine Take 112 mcg by mouth daily before breakfast.   SYSTANE 0.4-0.3 % Soln Generic drug:  Polyethyl Glycol-Propyl Glycol Place 1 drop into both eyes 3 (three) times daily as needed (for dry/irritated eyes.).   tiZANidine 2 MG tablet Commonly known as:  ZANAFLEX Take 1 tablet (2 mg total) by mouth every 6 (six) hours as needed.   tolterodine 4 MG 24 hr capsule Commonly known as:  DETROL LA Take 4 mg by mouth every morning.            Durable Medical Equipment  (From admission, onward)        Start     Ordered   08/02/17 1423  DME Walker rolling  Once    Question:  Patient needs a walker to treat with the following condition  Answer:  Status post right knee replacement   08/02/17 1422   08/02/17 1423  DME 3 n 1  Once     08/02/17 1422       Discharge Care Instructions  (From admission, onward)        Start     Ordered   08/05/17 0000  Weight bearing as tolerated     08/05/17 1351   08/04/17 0000  Weight bearing as tolerated    Question Answer Comment  Laterality right   Extremity Lower      06 /23/19 0932      Diagnostic Studies: Dg Chest 2 View  Result Date: 07/23/2017 CLINICAL DATA:  Preop knee surgery. EXAM: CHEST - 2 VIEW COMPARISON:  11/12/2013 FINDINGS: Nodular and linear densities noted in the left upper lobe. This may reflect scarring, but recommend further evaluation with chest CT. This is new since 2015. Right lung clear. Linear scarring at the right lung base. Heart is normal size. No effusions. Moderate compression fracture in the lower thoracic spine, new since 2015. IMPRESSION: Nodular and linear airspace densities in the left upper lobe. This  could reflect pneumonia or scarring. This could be further evaluated with chest CT. Moderate compression fracture in the lower thoracic spine, age indeterminate but new since 2015. Electronically Signed   By:  Rolm Baptise M.D.   On: 07/23/2017 08:20   Dg Knee 1-2 Views Right  Result Date: 08/02/2017 CLINICAL DATA:  h status post RIGHT total knee revision. EXAM: RIGHT KNEE - 1-2 VIEW COMPARISON:  None FINDINGS: Patient has RIGHT total knee arthroplasty. Gas is identified within the joint space. Prostheses appear intact and normally aligned. Lucency surrounding the tibial prosthetic shaft likely represents site of prior prosthesis. IMPRESSION: Status post total knee revision.  No adverse features. Electronically Signed   By: Nolon Nations M.D.   On: 08/02/2017 11:02    Disposition: Discharge disposition: 01-Home or Self Care       Discharge Instructions    Call MD / Call 911   Complete by:  As directed    If you experience chest pain or shortness of breath, CALL 911 and be transported to the hospital emergency room.  If you develope a fever above 101 F, pus (white drainage) or increased drainage or redness at the wound, or calf pain, call your surgeon's office.   Call MD / Call 911   Complete by:  As directed    If you experience chest pain or shortness of breath, CALL 911 and be transported to the hospital emergency room.  If you develope a fever above 101 F, pus (white drainage) or increased drainage or redness at the wound, or calf pain, call your surgeon's office.   Constipation Prevention   Complete by:  As directed    Drink plenty of fluids.  Prune juice may be helpful.  You may use a stool softener, such as Colace (over the counter) 100 mg twice a day.  Use MiraLax (over the counter) for constipation as needed.   Diet - low sodium heart healthy   Complete by:  As directed    Diet general   Complete by:  As directed    Driving restrictions   Complete by:  As directed    No  driving for 2 weeks   Increase activity slowly as tolerated   Complete by:  As directed    Increase activity slowly as tolerated   Complete by:  As directed    Patient may shower   Complete by:  As directed    You may shower without a dressing once there is no drainage.  Do not wash over the wound.  If drainage remains, cover wound with plastic wrap and then shower.   Weight bearing as tolerated   Complete by:  As directed    Laterality:  right   Extremity:  Lower   Weight bearing as tolerated   Complete by:  As directed       Follow-up Information    Frederik Pear, MD In 2 weeks.   Specialty:  Orthopedic Surgery Contact information: [place] [address] [phone]        [facility] Follow up.   Specialty:  Home Health Services Why:  Physical therapist will contact you to set up appointment. Contact information: [address] [place] [address] [phone]            Signed: Joanell Rising 08/05/2017, 1:51 PM

## 2017-08-05 NOTE — Progress Notes (Signed)
Occupational Therapy Treatment Patient Details Name: Lisa Duke MRN: [id number] DOB: [birth year] Today's Date: 08/05/2017    History of present illness Pt is a 72 y/o female s/p elective R total knee revision. PMH includes RA, fibromyalgia, and R TKA.    OT comments  Patient progressing well.  Demonstrates ability to complete bed mobility with supervision, toilet transfers using RW with supervision, and simulated walk in shower transfers with min guard assist.  Patient continues to be limited by decreased functional ROM and weakness in R LE, but progressing as expected s/p TKA.  Patient plans to dc home with spouse and friends support, has 24/7 assistance available.  Patient voices understanding of recommendations related to shower transfers, bathing, LB dressing and mobility (having assistance initially). No further questions or concerns.  Will continue to follow while admitted.     Follow Up Recommendations  No OT follow up;Supervision/Assistance - 24 hour    Equipment Recommendations  None recommended by OT(has needs met)    Recommendations for Other Services      Precautions / Restrictions Precautions Precautions: Knee Precaution Booklet Issued: No Restrictions Weight Bearing Restrictions: Yes RLE Weight Bearing: Weight bearing as tolerated       Mobility Bed Mobility Overal bed mobility: Needs Assistance Bed Mobility: Supine to Sit;Sit to Supine     Supine to sit: Supervision Sit to supine: Supervision   General bed mobility comments: simulate home enviornement with HOB at 0 degrees, no rails, exiting and entering towards R side   Transfers Overall transfer level: Needs assistance Equipment used: Rolling walker (2 wheeled) Transfers: Sit to/from Stand Sit to Stand: Supervision Stand pivot transfers: Supervision       General transfer comment: increased time required, supervision for safety using RW     Balance Overall balance assessment: Needs  assistance Sitting-balance support: No upper extremity supported;Feet supported Sitting balance-Leahy Scale: Good     Standing balance support: Bilateral upper extremity supported;During functional activity Standing balance-Leahy Scale: Poor Standing balance comment: Reliant on B UE support                           ADL either performed or assessed with clinical judgement   ADL Overall ADL's : Needs assistance/impaired                     Lower Body Dressing: Moderate assistance;Sit to/from stand Lower Body Dressing Details (indicate cue type and reason): still requires assistance with donning/doffing socks, but improving reach towards B feet; declines need to LB AE education/practice  Toilet Transfer: Supervision/safety;RW;Stand-pivot Toilet Transfer Details (indicate cue type and reason): stand pivot with RW, cueing for safety      Tub/ Shower Transfer: Min guard;Rolling walker;Ambulation Tub/Shower Transfer Details (indicate cue type and reason): simulated home setup with 3:1 commode, educated on backward stepping technique using RW over threshold, having spouse assistance and seated while bathing  Functional mobility during ADLs: Rolling walker;Min guard General ADL Comments: Short distance mobility using RW to simulate shower setup, good hand placement and safety with transfers      Vision       Perception     Praxis      Cognition Arousal/Alertness: Awake/alert Behavior During Therapy: WFL for tasks assessed/performed Overall Cognitive Status: Impaired/Different from baseline Area of Impairment: Attention;Awareness                   Current Attention Level: Selective  Awareness: Emergent   General Comments: improved cognition today, decreased attention to task requiring redirection        Exercises     Shoulder Instructions       General Comments      Pertinent Vitals/ Pain       Pain Assessment: Faces Faces Pain  Scale: Hurts a little bit Pain Location: R knee Pain Descriptors / Indicators: Aching;Operative site guarding Pain Intervention(s): Limited activity within patient's tolerance;Monitored during session;Repositioned  Home Living                                          Prior Functioning/Environment              Frequency  Min 2X/week        Progress Toward Goals  OT Goals(current goals can now be found in the care plan section)  Progress towards OT goals: Progressing toward goals  Acute Rehab OT Goals Patient Stated Goal: to go home  OT Goal Formulation: With patient Time For Goal Achievement: 08/17/17 Potential to Achieve Goals: Good  Plan Discharge plan remains appropriate;Frequency remains appropriate    Co-evaluation                 AM-PAC PT "6 Clicks" Daily Activity     Outcome Measure   Help from another person eating meals?: None Help from another person taking care of personal grooming?: None Help from another person toileting, which includes using toliet, bedpan, or urinal?: A Little Help from another person bathing (including washing, rinsing, drying)?: A Little Help from another person to put on and taking off regular upper body clothing?: None Help from another person to put on and taking off regular lower body clothing?: A Lot 6 Click Score: 20    End of Session Equipment Utilized During Treatment: Rolling walker  OT Visit Diagnosis: Other abnormalities of gait and mobility (R26.89);Pain Pain - Right/Left: Right Pain - part of body: Knee   Activity Tolerance Patient tolerated treatment well   Patient Left in bed;with call bell/phone within reach   Nurse Communication          Time: 0841-0902 OT Time Calculation (min): 21 min  Charges: OT General Charges $OT Visit: 1 Visit OT Treatments $Self Care/Home Management : 8-22 mins   S , OTR/L  Pager [phone]    S  08/05/2017, 9:52  AM    

## 2017-08-05 NOTE — Plan of Care (Signed)

## 2017-08-07 DIAGNOSIS — M069 Rheumatoid arthritis, unspecified: Secondary | ICD-10-CM | POA: Diagnosis not present

## 2017-08-07 DIAGNOSIS — F329 Major depressive disorder, single episode, unspecified: Secondary | ICD-10-CM | POA: Diagnosis not present

## 2017-08-07 DIAGNOSIS — M48062 Spinal stenosis, lumbar region with neurogenic claudication: Secondary | ICD-10-CM | POA: Diagnosis not present

## 2017-08-07 DIAGNOSIS — T84032D Mechanical loosening of internal right knee prosthetic joint, subsequent encounter: Secondary | ICD-10-CM | POA: Diagnosis not present

## 2017-08-07 DIAGNOSIS — F419 Anxiety disorder, unspecified: Secondary | ICD-10-CM | POA: Diagnosis not present

## 2017-08-07 DIAGNOSIS — J45909 Unspecified asthma, uncomplicated: Secondary | ICD-10-CM | POA: Diagnosis not present

## 2017-08-07 LAB — AEROBIC/ANAEROBIC CULTURE W GRAM STAIN (SURGICAL/DEEP WOUND): Culture: NO GROWTH

## 2017-08-07 LAB — AEROBIC/ANAEROBIC CULTURE (SURGICAL/DEEP WOUND): CULTURE: NO GROWTH

## 2017-08-08 DIAGNOSIS — M069 Rheumatoid arthritis, unspecified: Secondary | ICD-10-CM | POA: Diagnosis not present

## 2017-08-08 DIAGNOSIS — T84032D Mechanical loosening of internal right knee prosthetic joint, subsequent encounter: Secondary | ICD-10-CM | POA: Diagnosis not present

## 2017-08-08 DIAGNOSIS — M48062 Spinal stenosis, lumbar region with neurogenic claudication: Secondary | ICD-10-CM | POA: Diagnosis not present

## 2017-08-08 DIAGNOSIS — F419 Anxiety disorder, unspecified: Secondary | ICD-10-CM | POA: Diagnosis not present

## 2017-08-08 DIAGNOSIS — J45909 Unspecified asthma, uncomplicated: Secondary | ICD-10-CM | POA: Diagnosis not present

## 2017-08-08 DIAGNOSIS — F329 Major depressive disorder, single episode, unspecified: Secondary | ICD-10-CM | POA: Diagnosis not present

## 2017-08-10 DIAGNOSIS — M25561 Pain in right knee: Secondary | ICD-10-CM | POA: Diagnosis not present

## 2017-08-10 DIAGNOSIS — Z96651 Presence of right artificial knee joint: Secondary | ICD-10-CM | POA: Diagnosis not present

## 2017-08-10 DIAGNOSIS — Z471 Aftercare following joint replacement surgery: Secondary | ICD-10-CM | POA: Diagnosis not present

## 2017-08-12 DIAGNOSIS — T84032D Mechanical loosening of internal right knee prosthetic joint, subsequent encounter: Secondary | ICD-10-CM | POA: Diagnosis not present

## 2017-08-12 DIAGNOSIS — M069 Rheumatoid arthritis, unspecified: Secondary | ICD-10-CM | POA: Diagnosis not present

## 2017-08-12 DIAGNOSIS — J45909 Unspecified asthma, uncomplicated: Secondary | ICD-10-CM | POA: Diagnosis not present

## 2017-08-12 DIAGNOSIS — M48062 Spinal stenosis, lumbar region with neurogenic claudication: Secondary | ICD-10-CM | POA: Diagnosis not present

## 2017-08-12 DIAGNOSIS — F419 Anxiety disorder, unspecified: Secondary | ICD-10-CM | POA: Diagnosis not present

## 2017-08-12 DIAGNOSIS — F329 Major depressive disorder, single episode, unspecified: Secondary | ICD-10-CM | POA: Diagnosis not present

## 2017-08-14 DIAGNOSIS — Z96651 Presence of right artificial knee joint: Secondary | ICD-10-CM | POA: Diagnosis not present

## 2017-08-14 DIAGNOSIS — M25561 Pain in right knee: Secondary | ICD-10-CM | POA: Diagnosis not present

## 2017-08-23 DIAGNOSIS — M25561 Pain in right knee: Secondary | ICD-10-CM | POA: Diagnosis not present

## 2017-08-26 DIAGNOSIS — F329 Major depressive disorder, single episode, unspecified: Secondary | ICD-10-CM | POA: Diagnosis not present

## 2017-08-26 DIAGNOSIS — M069 Rheumatoid arthritis, unspecified: Secondary | ICD-10-CM | POA: Diagnosis not present

## 2017-08-26 DIAGNOSIS — J45909 Unspecified asthma, uncomplicated: Secondary | ICD-10-CM | POA: Diagnosis not present

## 2017-08-26 DIAGNOSIS — M48062 Spinal stenosis, lumbar region with neurogenic claudication: Secondary | ICD-10-CM | POA: Diagnosis not present

## 2017-08-26 DIAGNOSIS — F419 Anxiety disorder, unspecified: Secondary | ICD-10-CM | POA: Diagnosis not present

## 2017-08-26 DIAGNOSIS — T84032D Mechanical loosening of internal right knee prosthetic joint, subsequent encounter: Secondary | ICD-10-CM | POA: Diagnosis not present

## 2017-08-28 DIAGNOSIS — J45909 Unspecified asthma, uncomplicated: Secondary | ICD-10-CM | POA: Diagnosis not present

## 2017-08-28 DIAGNOSIS — F419 Anxiety disorder, unspecified: Secondary | ICD-10-CM | POA: Diagnosis not present

## 2017-08-28 DIAGNOSIS — M48062 Spinal stenosis, lumbar region with neurogenic claudication: Secondary | ICD-10-CM | POA: Diagnosis not present

## 2017-08-28 DIAGNOSIS — M069 Rheumatoid arthritis, unspecified: Secondary | ICD-10-CM | POA: Diagnosis not present

## 2017-08-28 DIAGNOSIS — F329 Major depressive disorder, single episode, unspecified: Secondary | ICD-10-CM | POA: Diagnosis not present

## 2017-08-28 DIAGNOSIS — T84032D Mechanical loosening of internal right knee prosthetic joint, subsequent encounter: Secondary | ICD-10-CM | POA: Diagnosis not present

## 2017-08-30 DIAGNOSIS — F419 Anxiety disorder, unspecified: Secondary | ICD-10-CM | POA: Diagnosis not present

## 2017-08-30 DIAGNOSIS — M069 Rheumatoid arthritis, unspecified: Secondary | ICD-10-CM | POA: Diagnosis not present

## 2017-08-30 DIAGNOSIS — F329 Major depressive disorder, single episode, unspecified: Secondary | ICD-10-CM | POA: Diagnosis not present

## 2017-08-30 DIAGNOSIS — T84032D Mechanical loosening of internal right knee prosthetic joint, subsequent encounter: Secondary | ICD-10-CM | POA: Diagnosis not present

## 2017-08-30 DIAGNOSIS — J45909 Unspecified asthma, uncomplicated: Secondary | ICD-10-CM | POA: Diagnosis not present

## 2017-08-30 DIAGNOSIS — M48062 Spinal stenosis, lumbar region with neurogenic claudication: Secondary | ICD-10-CM | POA: Diagnosis not present

## 2017-09-04 DIAGNOSIS — F419 Anxiety disorder, unspecified: Secondary | ICD-10-CM | POA: Diagnosis not present

## 2017-09-04 DIAGNOSIS — J45909 Unspecified asthma, uncomplicated: Secondary | ICD-10-CM | POA: Diagnosis not present

## 2017-09-04 DIAGNOSIS — F329 Major depressive disorder, single episode, unspecified: Secondary | ICD-10-CM | POA: Diagnosis not present

## 2017-09-04 DIAGNOSIS — M48062 Spinal stenosis, lumbar region with neurogenic claudication: Secondary | ICD-10-CM | POA: Diagnosis not present

## 2017-09-04 DIAGNOSIS — M069 Rheumatoid arthritis, unspecified: Secondary | ICD-10-CM | POA: Diagnosis not present

## 2017-09-04 DIAGNOSIS — T84032D Mechanical loosening of internal right knee prosthetic joint, subsequent encounter: Secondary | ICD-10-CM | POA: Diagnosis not present

## 2017-09-06 DIAGNOSIS — F419 Anxiety disorder, unspecified: Secondary | ICD-10-CM | POA: Diagnosis not present

## 2017-09-06 DIAGNOSIS — F329 Major depressive disorder, single episode, unspecified: Secondary | ICD-10-CM | POA: Diagnosis not present

## 2017-09-06 DIAGNOSIS — M069 Rheumatoid arthritis, unspecified: Secondary | ICD-10-CM | POA: Diagnosis not present

## 2017-09-06 DIAGNOSIS — J45909 Unspecified asthma, uncomplicated: Secondary | ICD-10-CM | POA: Diagnosis not present

## 2017-09-06 DIAGNOSIS — T84032D Mechanical loosening of internal right knee prosthetic joint, subsequent encounter: Secondary | ICD-10-CM | POA: Diagnosis not present

## 2017-09-06 DIAGNOSIS — M48062 Spinal stenosis, lumbar region with neurogenic claudication: Secondary | ICD-10-CM | POA: Diagnosis not present

## 2017-09-10 DIAGNOSIS — M25561 Pain in right knee: Secondary | ICD-10-CM | POA: Diagnosis not present

## 2017-09-10 DIAGNOSIS — Z471 Aftercare following joint replacement surgery: Secondary | ICD-10-CM | POA: Diagnosis not present

## 2017-09-10 DIAGNOSIS — Z96651 Presence of right artificial knee joint: Secondary | ICD-10-CM | POA: Diagnosis not present

## 2017-09-16 DIAGNOSIS — Z79899 Other long term (current) drug therapy: Secondary | ICD-10-CM | POA: Diagnosis not present

## 2017-09-16 DIAGNOSIS — M150 Primary generalized (osteo)arthritis: Secondary | ICD-10-CM | POA: Diagnosis not present

## 2017-09-16 DIAGNOSIS — M797 Fibromyalgia: Secondary | ICD-10-CM | POA: Diagnosis not present

## 2017-09-16 DIAGNOSIS — M2550 Pain in unspecified joint: Secondary | ICD-10-CM | POA: Diagnosis not present

## 2017-09-16 DIAGNOSIS — M0579 Rheumatoid arthritis with rheumatoid factor of multiple sites without organ or systems involvement: Secondary | ICD-10-CM | POA: Diagnosis not present

## 2017-10-01 DIAGNOSIS — M0579 Rheumatoid arthritis with rheumatoid factor of multiple sites without organ or systems involvement: Secondary | ICD-10-CM | POA: Diagnosis not present

## 2017-10-17 DIAGNOSIS — M0579 Rheumatoid arthritis with rheumatoid factor of multiple sites without organ or systems involvement: Secondary | ICD-10-CM | POA: Diagnosis not present

## 2017-10-17 DIAGNOSIS — R309 Painful micturition, unspecified: Secondary | ICD-10-CM | POA: Diagnosis not present

## 2017-11-14 DIAGNOSIS — Z79899 Other long term (current) drug therapy: Secondary | ICD-10-CM | POA: Diagnosis not present

## 2017-11-14 DIAGNOSIS — M0579 Rheumatoid arthritis with rheumatoid factor of multiple sites without organ or systems involvement: Secondary | ICD-10-CM | POA: Diagnosis not present

## 2017-12-06 DIAGNOSIS — Z23 Encounter for immunization: Secondary | ICD-10-CM | POA: Diagnosis not present

## 2017-12-18 DIAGNOSIS — E669 Obesity, unspecified: Secondary | ICD-10-CM | POA: Diagnosis not present

## 2017-12-18 DIAGNOSIS — M0579 Rheumatoid arthritis with rheumatoid factor of multiple sites without organ or systems involvement: Secondary | ICD-10-CM | POA: Diagnosis not present

## 2017-12-18 DIAGNOSIS — M2550 Pain in unspecified joint: Secondary | ICD-10-CM | POA: Diagnosis not present

## 2017-12-18 DIAGNOSIS — Z6838 Body mass index (BMI) 38.0-38.9, adult: Secondary | ICD-10-CM | POA: Diagnosis not present

## 2017-12-18 DIAGNOSIS — M150 Primary generalized (osteo)arthritis: Secondary | ICD-10-CM | POA: Diagnosis not present

## 2017-12-18 DIAGNOSIS — M797 Fibromyalgia: Secondary | ICD-10-CM | POA: Diagnosis not present

## 2017-12-18 DIAGNOSIS — M545 Low back pain: Secondary | ICD-10-CM | POA: Diagnosis not present

## 2017-12-18 DIAGNOSIS — Z79899 Other long term (current) drug therapy: Secondary | ICD-10-CM | POA: Diagnosis not present

## 2017-12-26 DIAGNOSIS — M0579 Rheumatoid arthritis with rheumatoid factor of multiple sites without organ or systems involvement: Secondary | ICD-10-CM | POA: Diagnosis not present

## 2018-01-22 DIAGNOSIS — M25561 Pain in right knee: Secondary | ICD-10-CM | POA: Diagnosis not present

## 2018-02-06 DIAGNOSIS — Z79899 Other long term (current) drug therapy: Secondary | ICD-10-CM | POA: Diagnosis not present

## 2018-02-06 DIAGNOSIS — M0579 Rheumatoid arthritis with rheumatoid factor of multiple sites without organ or systems involvement: Secondary | ICD-10-CM | POA: Diagnosis not present

## 2018-02-07 DIAGNOSIS — F338 Other recurrent depressive disorders: Secondary | ICD-10-CM | POA: Diagnosis not present

## 2018-02-07 DIAGNOSIS — M797 Fibromyalgia: Secondary | ICD-10-CM | POA: Diagnosis not present

## 2018-02-07 DIAGNOSIS — M069 Rheumatoid arthritis, unspecified: Secondary | ICD-10-CM | POA: Diagnosis not present

## 2018-02-07 DIAGNOSIS — E039 Hypothyroidism, unspecified: Secondary | ICD-10-CM | POA: Diagnosis not present

## 2018-02-07 DIAGNOSIS — F419 Anxiety disorder, unspecified: Secondary | ICD-10-CM | POA: Diagnosis not present

## 2018-02-07 DIAGNOSIS — R609 Edema, unspecified: Secondary | ICD-10-CM | POA: Diagnosis not present

## 2018-02-07 DIAGNOSIS — Z86010 Personal history of colonic polyps: Secondary | ICD-10-CM | POA: Diagnosis not present

## 2018-03-24 DIAGNOSIS — M0579 Rheumatoid arthritis with rheumatoid factor of multiple sites without organ or systems involvement: Secondary | ICD-10-CM | POA: Diagnosis not present

## 2018-03-27 DIAGNOSIS — Z5189 Encounter for other specified aftercare: Secondary | ICD-10-CM | POA: Diagnosis not present

## 2018-04-16 DIAGNOSIS — M150 Primary generalized (osteo)arthritis: Secondary | ICD-10-CM | POA: Diagnosis not present

## 2018-04-16 DIAGNOSIS — M797 Fibromyalgia: Secondary | ICD-10-CM | POA: Diagnosis not present

## 2018-04-16 DIAGNOSIS — E669 Obesity, unspecified: Secondary | ICD-10-CM | POA: Diagnosis not present

## 2018-04-16 DIAGNOSIS — M545 Low back pain: Secondary | ICD-10-CM | POA: Diagnosis not present

## 2018-04-16 DIAGNOSIS — M0579 Rheumatoid arthritis with rheumatoid factor of multiple sites without organ or systems involvement: Secondary | ICD-10-CM | POA: Diagnosis not present

## 2018-04-16 DIAGNOSIS — Z6839 Body mass index (BMI) 39.0-39.9, adult: Secondary | ICD-10-CM | POA: Diagnosis not present

## 2018-04-16 DIAGNOSIS — M2550 Pain in unspecified joint: Secondary | ICD-10-CM | POA: Diagnosis not present

## 2018-04-16 DIAGNOSIS — Z79899 Other long term (current) drug therapy: Secondary | ICD-10-CM | POA: Diagnosis not present

## 2018-05-06 DIAGNOSIS — M0579 Rheumatoid arthritis with rheumatoid factor of multiple sites without organ or systems involvement: Secondary | ICD-10-CM | POA: Diagnosis not present

## 2018-05-15 DIAGNOSIS — N3000 Acute cystitis without hematuria: Secondary | ICD-10-CM | POA: Diagnosis not present

## 2018-06-18 DIAGNOSIS — M0579 Rheumatoid arthritis with rheumatoid factor of multiple sites without organ or systems involvement: Secondary | ICD-10-CM | POA: Diagnosis not present

## 2018-08-04 DIAGNOSIS — M0579 Rheumatoid arthritis with rheumatoid factor of multiple sites without organ or systems involvement: Secondary | ICD-10-CM | POA: Diagnosis not present

## 2018-09-11 DIAGNOSIS — Z13220 Encounter for screening for lipoid disorders: Secondary | ICD-10-CM | POA: Diagnosis not present

## 2018-09-11 DIAGNOSIS — F419 Anxiety disorder, unspecified: Secondary | ICD-10-CM | POA: Diagnosis not present

## 2018-09-11 DIAGNOSIS — Z79899 Other long term (current) drug therapy: Secondary | ICD-10-CM | POA: Diagnosis not present

## 2018-09-11 DIAGNOSIS — R609 Edema, unspecified: Secondary | ICD-10-CM | POA: Diagnosis not present

## 2018-09-11 DIAGNOSIS — E039 Hypothyroidism, unspecified: Secondary | ICD-10-CM | POA: Diagnosis not present

## 2018-09-11 DIAGNOSIS — F338 Other recurrent depressive disorders: Secondary | ICD-10-CM | POA: Diagnosis not present

## 2018-09-11 DIAGNOSIS — M069 Rheumatoid arthritis, unspecified: Secondary | ICD-10-CM | POA: Diagnosis not present

## 2018-09-11 DIAGNOSIS — Z86010 Personal history of colonic polyps: Secondary | ICD-10-CM | POA: Diagnosis not present

## 2018-09-11 DIAGNOSIS — M797 Fibromyalgia: Secondary | ICD-10-CM | POA: Diagnosis not present

## 2018-09-24 DIAGNOSIS — M0579 Rheumatoid arthritis with rheumatoid factor of multiple sites without organ or systems involvement: Secondary | ICD-10-CM | POA: Diagnosis not present

## 2018-10-13 DIAGNOSIS — E039 Hypothyroidism, unspecified: Secondary | ICD-10-CM | POA: Diagnosis not present

## 2018-10-13 DIAGNOSIS — F329 Major depressive disorder, single episode, unspecified: Secondary | ICD-10-CM | POA: Diagnosis not present

## 2018-10-13 DIAGNOSIS — M069 Rheumatoid arthritis, unspecified: Secondary | ICD-10-CM | POA: Diagnosis not present

## 2018-11-05 DIAGNOSIS — M0579 Rheumatoid arthritis with rheumatoid factor of multiple sites without organ or systems involvement: Secondary | ICD-10-CM | POA: Diagnosis not present

## 2018-11-26 DIAGNOSIS — Z23 Encounter for immunization: Secondary | ICD-10-CM | POA: Diagnosis not present

## 2018-11-28 DIAGNOSIS — Z13220 Encounter for screening for lipoid disorders: Secondary | ICD-10-CM | POA: Diagnosis not present

## 2018-11-28 DIAGNOSIS — Z23 Encounter for immunization: Secondary | ICD-10-CM | POA: Diagnosis not present

## 2018-11-28 DIAGNOSIS — M797 Fibromyalgia: Secondary | ICD-10-CM | POA: Diagnosis not present

## 2018-11-28 DIAGNOSIS — Z86010 Personal history of colonic polyps: Secondary | ICD-10-CM | POA: Diagnosis not present

## 2018-11-28 DIAGNOSIS — R609 Edema, unspecified: Secondary | ICD-10-CM | POA: Diagnosis not present

## 2018-11-28 DIAGNOSIS — F338 Other recurrent depressive disorders: Secondary | ICD-10-CM | POA: Diagnosis not present

## 2018-11-28 DIAGNOSIS — F419 Anxiety disorder, unspecified: Secondary | ICD-10-CM | POA: Diagnosis not present

## 2018-11-28 DIAGNOSIS — Z79899 Other long term (current) drug therapy: Secondary | ICD-10-CM | POA: Diagnosis not present

## 2018-11-28 DIAGNOSIS — E039 Hypothyroidism, unspecified: Secondary | ICD-10-CM | POA: Diagnosis not present

## 2018-11-28 DIAGNOSIS — M069 Rheumatoid arthritis, unspecified: Secondary | ICD-10-CM | POA: Diagnosis not present

## 2018-12-23 DIAGNOSIS — Z79899 Other long term (current) drug therapy: Secondary | ICD-10-CM | POA: Diagnosis not present

## 2018-12-23 DIAGNOSIS — M0579 Rheumatoid arthritis with rheumatoid factor of multiple sites without organ or systems involvement: Secondary | ICD-10-CM | POA: Diagnosis not present

## 2019-01-15 DIAGNOSIS — F329 Major depressive disorder, single episode, unspecified: Secondary | ICD-10-CM | POA: Diagnosis not present

## 2019-03-05 DIAGNOSIS — R0981 Nasal congestion: Secondary | ICD-10-CM | POA: Diagnosis not present

## 2019-03-05 DIAGNOSIS — Z03818 Encounter for observation for suspected exposure to other biological agents ruled out: Secondary | ICD-10-CM | POA: Diagnosis not present

## 2019-04-01 DIAGNOSIS — M0579 Rheumatoid arthritis with rheumatoid factor of multiple sites without organ or systems involvement: Secondary | ICD-10-CM | POA: Diagnosis not present

## 2019-04-07 DIAGNOSIS — M069 Rheumatoid arthritis, unspecified: Secondary | ICD-10-CM | POA: Diagnosis not present

## 2019-04-07 DIAGNOSIS — F329 Major depressive disorder, single episode, unspecified: Secondary | ICD-10-CM | POA: Diagnosis not present

## 2019-04-07 DIAGNOSIS — E039 Hypothyroidism, unspecified: Secondary | ICD-10-CM | POA: Diagnosis not present

## 2019-04-08 DIAGNOSIS — Z23 Encounter for immunization: Secondary | ICD-10-CM | POA: Diagnosis not present

## 2019-04-15 DIAGNOSIS — Z79899 Other long term (current) drug therapy: Secondary | ICD-10-CM | POA: Diagnosis not present

## 2019-04-15 DIAGNOSIS — M0579 Rheumatoid arthritis with rheumatoid factor of multiple sites without organ or systems involvement: Secondary | ICD-10-CM | POA: Diagnosis not present

## 2019-04-17 DIAGNOSIS — R300 Dysuria: Secondary | ICD-10-CM | POA: Diagnosis not present

## 2019-04-29 DIAGNOSIS — M0579 Rheumatoid arthritis with rheumatoid factor of multiple sites without organ or systems involvement: Secondary | ICD-10-CM | POA: Diagnosis not present

## 2019-05-06 DIAGNOSIS — Z23 Encounter for immunization: Secondary | ICD-10-CM | POA: Diagnosis not present

## 2019-06-03 DIAGNOSIS — M0579 Rheumatoid arthritis with rheumatoid factor of multiple sites without organ or systems involvement: Secondary | ICD-10-CM | POA: Diagnosis not present

## 2019-07-01 DIAGNOSIS — M0579 Rheumatoid arthritis with rheumatoid factor of multiple sites without organ or systems involvement: Secondary | ICD-10-CM | POA: Diagnosis not present

## 2019-07-31 DIAGNOSIS — R300 Dysuria: Secondary | ICD-10-CM | POA: Diagnosis not present

## 2019-07-31 DIAGNOSIS — Z23 Encounter for immunization: Secondary | ICD-10-CM | POA: Diagnosis not present

## 2019-07-31 DIAGNOSIS — Z13220 Encounter for screening for lipoid disorders: Secondary | ICD-10-CM | POA: Diagnosis not present

## 2019-07-31 DIAGNOSIS — Z13820 Encounter for screening for osteoporosis: Secondary | ICD-10-CM | POA: Diagnosis not present

## 2019-07-31 DIAGNOSIS — F338 Other recurrent depressive disorders: Secondary | ICD-10-CM | POA: Diagnosis not present

## 2019-07-31 DIAGNOSIS — E039 Hypothyroidism, unspecified: Secondary | ICD-10-CM | POA: Diagnosis not present

## 2019-07-31 DIAGNOSIS — M797 Fibromyalgia: Secondary | ICD-10-CM | POA: Diagnosis not present

## 2019-07-31 DIAGNOSIS — Z86010 Personal history of colonic polyps: Secondary | ICD-10-CM | POA: Diagnosis not present

## 2019-07-31 DIAGNOSIS — F419 Anxiety disorder, unspecified: Secondary | ICD-10-CM | POA: Diagnosis not present

## 2019-07-31 DIAGNOSIS — M069 Rheumatoid arthritis, unspecified: Secondary | ICD-10-CM | POA: Diagnosis not present

## 2019-07-31 DIAGNOSIS — R609 Edema, unspecified: Secondary | ICD-10-CM | POA: Diagnosis not present

## 2019-07-31 DIAGNOSIS — Z79899 Other long term (current) drug therapy: Secondary | ICD-10-CM | POA: Diagnosis not present

## 2019-08-03 ENCOUNTER — Other Ambulatory Visit: Payer: Self-pay | Admitting: Family Medicine

## 2019-08-03 DIAGNOSIS — Z13820 Encounter for screening for osteoporosis: Secondary | ICD-10-CM

## 2019-09-11 DIAGNOSIS — M0579 Rheumatoid arthritis with rheumatoid factor of multiple sites without organ or systems involvement: Secondary | ICD-10-CM | POA: Diagnosis not present

## 2019-09-16 DIAGNOSIS — M0579 Rheumatoid arthritis with rheumatoid factor of multiple sites without organ or systems involvement: Secondary | ICD-10-CM | POA: Diagnosis not present

## 2019-09-16 DIAGNOSIS — Z6841 Body Mass Index (BMI) 40.0 and over, adult: Secondary | ICD-10-CM | POA: Diagnosis not present

## 2019-09-16 DIAGNOSIS — M2550 Pain in unspecified joint: Secondary | ICD-10-CM | POA: Diagnosis not present

## 2019-09-16 DIAGNOSIS — M150 Primary generalized (osteo)arthritis: Secondary | ICD-10-CM | POA: Diagnosis not present

## 2019-09-16 DIAGNOSIS — Z79899 Other long term (current) drug therapy: Secondary | ICD-10-CM | POA: Diagnosis not present

## 2019-09-16 DIAGNOSIS — M797 Fibromyalgia: Secondary | ICD-10-CM | POA: Diagnosis not present

## 2019-09-25 DIAGNOSIS — M797 Fibromyalgia: Secondary | ICD-10-CM | POA: Diagnosis not present

## 2019-09-25 DIAGNOSIS — F419 Anxiety disorder, unspecified: Secondary | ICD-10-CM | POA: Diagnosis not present

## 2019-09-25 DIAGNOSIS — F338 Other recurrent depressive disorders: Secondary | ICD-10-CM | POA: Diagnosis not present

## 2019-09-25 DIAGNOSIS — E039 Hypothyroidism, unspecified: Secondary | ICD-10-CM | POA: Diagnosis not present

## 2019-09-25 DIAGNOSIS — Z79899 Other long term (current) drug therapy: Secondary | ICD-10-CM | POA: Diagnosis not present

## 2019-09-25 DIAGNOSIS — Z23 Encounter for immunization: Secondary | ICD-10-CM | POA: Diagnosis not present

## 2019-09-25 DIAGNOSIS — M069 Rheumatoid arthritis, unspecified: Secondary | ICD-10-CM | POA: Diagnosis not present

## 2019-09-25 DIAGNOSIS — Z136 Encounter for screening for cardiovascular disorders: Secondary | ICD-10-CM | POA: Diagnosis not present

## 2019-09-25 DIAGNOSIS — Z13820 Encounter for screening for osteoporosis: Secondary | ICD-10-CM | POA: Diagnosis not present

## 2019-09-25 DIAGNOSIS — R300 Dysuria: Secondary | ICD-10-CM | POA: Diagnosis not present

## 2019-09-25 DIAGNOSIS — R609 Edema, unspecified: Secondary | ICD-10-CM | POA: Diagnosis not present

## 2019-09-25 DIAGNOSIS — Z86010 Personal history of colonic polyps: Secondary | ICD-10-CM | POA: Diagnosis not present

## 2019-09-28 ENCOUNTER — Other Ambulatory Visit: Payer: Self-pay | Admitting: Family Medicine

## 2019-09-28 DIAGNOSIS — Z1231 Encounter for screening mammogram for malignant neoplasm of breast: Secondary | ICD-10-CM

## 2019-10-07 ENCOUNTER — Ambulatory Visit: Payer: BLUE CROSS/BLUE SHIELD

## 2019-10-09 DIAGNOSIS — M0579 Rheumatoid arthritis with rheumatoid factor of multiple sites without organ or systems involvement: Secondary | ICD-10-CM | POA: Diagnosis not present

## 2019-10-29 ENCOUNTER — Ambulatory Visit
Admission: RE | Admit: 2019-10-29 | Discharge: 2019-10-29 | Disposition: A | Discharge status: Home / Self Care | Payer: Medicare Other | Source: Ambulatory Visit | Attending: Family Medicine | Admitting: Family Medicine

## 2019-10-29 ENCOUNTER — Other Ambulatory Visit: Payer: Self-pay

## 2019-10-29 DIAGNOSIS — Z13820 Encounter for screening for osteoporosis: Secondary | ICD-10-CM

## 2019-10-29 DIAGNOSIS — Z780 Asymptomatic menopausal state: Secondary | ICD-10-CM | POA: Diagnosis not present

## 2019-10-29 DIAGNOSIS — Z1231 Encounter for screening mammogram for malignant neoplasm of breast: Secondary | ICD-10-CM | POA: Diagnosis not present

## 2019-10-29 DIAGNOSIS — M8589 Other specified disorders of bone density and structure, multiple sites: Secondary | ICD-10-CM | POA: Diagnosis not present

## 2019-11-11 DIAGNOSIS — M0579 Rheumatoid arthritis with rheumatoid factor of multiple sites without organ or systems involvement: Secondary | ICD-10-CM | POA: Diagnosis not present

## 2019-11-25 DIAGNOSIS — Z23 Encounter for immunization: Secondary | ICD-10-CM | POA: Diagnosis not present

## 2019-12-09 DIAGNOSIS — M0579 Rheumatoid arthritis with rheumatoid factor of multiple sites without organ or systems involvement: Secondary | ICD-10-CM | POA: Diagnosis not present

## 2020-01-03 DIAGNOSIS — Z23 Encounter for immunization: Secondary | ICD-10-CM | POA: Diagnosis not present

## 2020-01-12 DIAGNOSIS — M0579 Rheumatoid arthritis with rheumatoid factor of multiple sites without organ or systems involvement: Secondary | ICD-10-CM | POA: Diagnosis not present

## 2020-02-17 DIAGNOSIS — M0579 Rheumatoid arthritis with rheumatoid factor of multiple sites without organ or systems involvement: Secondary | ICD-10-CM | POA: Diagnosis not present

## 2020-03-16 DIAGNOSIS — R5383 Other fatigue: Secondary | ICD-10-CM | POA: Diagnosis not present

## 2020-03-16 DIAGNOSIS — Z111 Encounter for screening for respiratory tuberculosis: Secondary | ICD-10-CM | POA: Diagnosis not present

## 2020-03-16 DIAGNOSIS — M0579 Rheumatoid arthritis with rheumatoid factor of multiple sites without organ or systems involvement: Secondary | ICD-10-CM | POA: Diagnosis not present

## 2020-03-16 DIAGNOSIS — Z79899 Other long term (current) drug therapy: Secondary | ICD-10-CM | POA: Diagnosis not present

## 2020-04-27 DIAGNOSIS — M0579 Rheumatoid arthritis with rheumatoid factor of multiple sites without organ or systems involvement: Secondary | ICD-10-CM | POA: Diagnosis not present

## 2020-05-04 DIAGNOSIS — M2550 Pain in unspecified joint: Secondary | ICD-10-CM | POA: Diagnosis not present

## 2020-05-04 DIAGNOSIS — M0579 Rheumatoid arthritis with rheumatoid factor of multiple sites without organ or systems involvement: Secondary | ICD-10-CM | POA: Diagnosis not present

## 2020-05-04 DIAGNOSIS — M150 Primary generalized (osteo)arthritis: Secondary | ICD-10-CM | POA: Diagnosis not present

## 2020-05-04 DIAGNOSIS — Z79899 Other long term (current) drug therapy: Secondary | ICD-10-CM | POA: Diagnosis not present

## 2020-05-04 DIAGNOSIS — Z6841 Body Mass Index (BMI) 40.0 and over, adult: Secondary | ICD-10-CM | POA: Diagnosis not present

## 2020-05-04 DIAGNOSIS — M797 Fibromyalgia: Secondary | ICD-10-CM | POA: Diagnosis not present

## 2020-05-25 DIAGNOSIS — M0579 Rheumatoid arthritis with rheumatoid factor of multiple sites without organ or systems involvement: Secondary | ICD-10-CM | POA: Diagnosis not present

## 2020-06-22 DIAGNOSIS — M0579 Rheumatoid arthritis with rheumatoid factor of multiple sites without organ or systems involvement: Secondary | ICD-10-CM | POA: Diagnosis not present

## 2020-08-03 DIAGNOSIS — Z0000 Encounter for general adult medical examination without abnormal findings: Secondary | ICD-10-CM | POA: Diagnosis not present

## 2020-08-16 DIAGNOSIS — M0579 Rheumatoid arthritis with rheumatoid factor of multiple sites without organ or systems involvement: Secondary | ICD-10-CM | POA: Diagnosis not present

## 2020-08-25 DIAGNOSIS — E039 Hypothyroidism, unspecified: Secondary | ICD-10-CM | POA: Diagnosis not present

## 2020-08-25 DIAGNOSIS — E782 Mixed hyperlipidemia: Secondary | ICD-10-CM | POA: Diagnosis not present

## 2020-08-25 DIAGNOSIS — F419 Anxiety disorder, unspecified: Secondary | ICD-10-CM | POA: Diagnosis not present

## 2020-08-25 DIAGNOSIS — G4700 Insomnia, unspecified: Secondary | ICD-10-CM | POA: Diagnosis not present

## 2020-08-25 DIAGNOSIS — N390 Urinary tract infection, site not specified: Secondary | ICD-10-CM | POA: Diagnosis not present

## 2020-08-25 DIAGNOSIS — M069 Rheumatoid arthritis, unspecified: Secondary | ICD-10-CM | POA: Diagnosis not present

## 2020-08-25 DIAGNOSIS — F338 Other recurrent depressive disorders: Secondary | ICD-10-CM | POA: Diagnosis not present

## 2020-09-14 DIAGNOSIS — M0579 Rheumatoid arthritis with rheumatoid factor of multiple sites without organ or systems involvement: Secondary | ICD-10-CM | POA: Diagnosis not present

## 2020-09-27 DIAGNOSIS — H02831 Dermatochalasis of right upper eyelid: Secondary | ICD-10-CM | POA: Diagnosis not present

## 2020-09-27 DIAGNOSIS — H43393 Other vitreous opacities, bilateral: Secondary | ICD-10-CM | POA: Diagnosis not present

## 2020-09-27 DIAGNOSIS — H5203 Hypermetropia, bilateral: Secondary | ICD-10-CM | POA: Diagnosis not present

## 2020-09-27 DIAGNOSIS — H16223 Keratoconjunctivitis sicca, not specified as Sjogren's, bilateral: Secondary | ICD-10-CM | POA: Diagnosis not present

## 2020-09-27 DIAGNOSIS — H524 Presbyopia: Secondary | ICD-10-CM | POA: Diagnosis not present

## 2020-09-27 DIAGNOSIS — H2513 Age-related nuclear cataract, bilateral: Secondary | ICD-10-CM | POA: Diagnosis not present

## 2020-09-27 DIAGNOSIS — H52203 Unspecified astigmatism, bilateral: Secondary | ICD-10-CM | POA: Diagnosis not present

## 2020-09-27 DIAGNOSIS — H02834 Dermatochalasis of left upper eyelid: Secondary | ICD-10-CM | POA: Diagnosis not present

## 2020-10-18 DIAGNOSIS — H02831 Dermatochalasis of right upper eyelid: Secondary | ICD-10-CM | POA: Diagnosis not present

## 2020-10-18 DIAGNOSIS — H02834 Dermatochalasis of left upper eyelid: Secondary | ICD-10-CM | POA: Diagnosis not present

## 2020-11-02 DIAGNOSIS — H02834 Dermatochalasis of left upper eyelid: Secondary | ICD-10-CM | POA: Diagnosis not present

## 2020-11-02 DIAGNOSIS — H02831 Dermatochalasis of right upper eyelid: Secondary | ICD-10-CM | POA: Diagnosis not present

## 2020-11-22 DIAGNOSIS — Z79899 Other long term (current) drug therapy: Secondary | ICD-10-CM | POA: Diagnosis not present

## 2020-11-22 DIAGNOSIS — M0579 Rheumatoid arthritis with rheumatoid factor of multiple sites without organ or systems involvement: Secondary | ICD-10-CM | POA: Diagnosis not present

## 2020-12-03 DIAGNOSIS — Z23 Encounter for immunization: Secondary | ICD-10-CM | POA: Diagnosis not present

## 2020-12-21 DIAGNOSIS — M0579 Rheumatoid arthritis with rheumatoid factor of multiple sites without organ or systems involvement: Secondary | ICD-10-CM | POA: Diagnosis not present

## 2020-12-27 DIAGNOSIS — Z79899 Other long term (current) drug therapy: Secondary | ICD-10-CM | POA: Diagnosis not present

## 2020-12-27 DIAGNOSIS — M150 Primary generalized (osteo)arthritis: Secondary | ICD-10-CM | POA: Diagnosis not present

## 2020-12-27 DIAGNOSIS — Z6839 Body mass index (BMI) 39.0-39.9, adult: Secondary | ICD-10-CM | POA: Diagnosis not present

## 2020-12-27 DIAGNOSIS — E669 Obesity, unspecified: Secondary | ICD-10-CM | POA: Diagnosis not present

## 2020-12-27 DIAGNOSIS — M797 Fibromyalgia: Secondary | ICD-10-CM | POA: Diagnosis not present

## 2020-12-27 DIAGNOSIS — M0579 Rheumatoid arthritis with rheumatoid factor of multiple sites without organ or systems involvement: Secondary | ICD-10-CM | POA: Diagnosis not present

## 2020-12-27 DIAGNOSIS — M2550 Pain in unspecified joint: Secondary | ICD-10-CM | POA: Diagnosis not present

## 2021-01-19 DIAGNOSIS — R300 Dysuria: Secondary | ICD-10-CM | POA: Diagnosis not present

## 2021-01-19 DIAGNOSIS — M0579 Rheumatoid arthritis with rheumatoid factor of multiple sites without organ or systems involvement: Secondary | ICD-10-CM | POA: Diagnosis not present

## 2021-02-07 DIAGNOSIS — N390 Urinary tract infection, site not specified: Secondary | ICD-10-CM | POA: Diagnosis not present

## 2021-02-07 DIAGNOSIS — F338 Other recurrent depressive disorders: Secondary | ICD-10-CM | POA: Diagnosis not present

## 2021-02-07 DIAGNOSIS — M797 Fibromyalgia: Secondary | ICD-10-CM | POA: Diagnosis not present

## 2021-02-07 DIAGNOSIS — R21 Rash and other nonspecific skin eruption: Secondary | ICD-10-CM | POA: Diagnosis not present

## 2021-02-07 DIAGNOSIS — E039 Hypothyroidism, unspecified: Secondary | ICD-10-CM | POA: Diagnosis not present

## 2021-02-07 DIAGNOSIS — F419 Anxiety disorder, unspecified: Secondary | ICD-10-CM | POA: Diagnosis not present

## 2021-02-07 DIAGNOSIS — G4700 Insomnia, unspecified: Secondary | ICD-10-CM | POA: Diagnosis not present

## 2021-02-21 DIAGNOSIS — M0579 Rheumatoid arthritis with rheumatoid factor of multiple sites without organ or systems involvement: Secondary | ICD-10-CM | POA: Diagnosis not present

## 2021-02-21 DIAGNOSIS — Z79899 Other long term (current) drug therapy: Secondary | ICD-10-CM | POA: Diagnosis not present

## 2021-03-28 DIAGNOSIS — Z79899 Other long term (current) drug therapy: Secondary | ICD-10-CM | POA: Diagnosis not present

## 2021-03-28 DIAGNOSIS — M0579 Rheumatoid arthritis with rheumatoid factor of multiple sites without organ or systems involvement: Secondary | ICD-10-CM | POA: Diagnosis not present

## 2021-03-28 DIAGNOSIS — R5383 Other fatigue: Secondary | ICD-10-CM | POA: Diagnosis not present

## 2021-03-28 DIAGNOSIS — Z111 Encounter for screening for respiratory tuberculosis: Secondary | ICD-10-CM | POA: Diagnosis not present

## 2021-03-28 DIAGNOSIS — R300 Dysuria: Secondary | ICD-10-CM | POA: Diagnosis not present

## 2021-04-25 DIAGNOSIS — M0579 Rheumatoid arthritis with rheumatoid factor of multiple sites without organ or systems involvement: Secondary | ICD-10-CM | POA: Diagnosis not present

## 2021-05-23 DIAGNOSIS — M0579 Rheumatoid arthritis with rheumatoid factor of multiple sites without organ or systems involvement: Secondary | ICD-10-CM | POA: Diagnosis not present

## 2021-05-23 DIAGNOSIS — Z79899 Other long term (current) drug therapy: Secondary | ICD-10-CM | POA: Diagnosis not present

## 2021-06-20 DIAGNOSIS — M0579 Rheumatoid arthritis with rheumatoid factor of multiple sites without organ or systems involvement: Secondary | ICD-10-CM | POA: Diagnosis not present

## 2021-06-27 DIAGNOSIS — M0579 Rheumatoid arthritis with rheumatoid factor of multiple sites without organ or systems involvement: Secondary | ICD-10-CM | POA: Diagnosis not present

## 2021-06-27 DIAGNOSIS — M797 Fibromyalgia: Secondary | ICD-10-CM | POA: Diagnosis not present

## 2021-06-27 DIAGNOSIS — Z6841 Body Mass Index (BMI) 40.0 and over, adult: Secondary | ICD-10-CM | POA: Diagnosis not present

## 2021-06-27 DIAGNOSIS — M1991 Primary osteoarthritis, unspecified site: Secondary | ICD-10-CM | POA: Diagnosis not present

## 2021-06-27 DIAGNOSIS — Z79899 Other long term (current) drug therapy: Secondary | ICD-10-CM | POA: Diagnosis not present

## 2021-07-26 DIAGNOSIS — Z79899 Other long term (current) drug therapy: Secondary | ICD-10-CM | POA: Diagnosis not present

## 2021-07-26 DIAGNOSIS — M0579 Rheumatoid arthritis with rheumatoid factor of multiple sites without organ or systems involvement: Secondary | ICD-10-CM | POA: Diagnosis not present

## 2021-08-24 DIAGNOSIS — M0579 Rheumatoid arthritis with rheumatoid factor of multiple sites without organ or systems involvement: Secondary | ICD-10-CM | POA: Diagnosis not present

## 2021-09-10 DIAGNOSIS — Z23 Encounter for immunization: Secondary | ICD-10-CM | POA: Diagnosis not present

## 2021-09-28 DIAGNOSIS — M0579 Rheumatoid arthritis with rheumatoid factor of multiple sites without organ or systems involvement: Secondary | ICD-10-CM | POA: Diagnosis not present

## 2021-11-02 DIAGNOSIS — Z79899 Other long term (current) drug therapy: Secondary | ICD-10-CM | POA: Diagnosis not present

## 2021-11-02 DIAGNOSIS — M0579 Rheumatoid arthritis with rheumatoid factor of multiple sites without organ or systems involvement: Secondary | ICD-10-CM | POA: Diagnosis not present

## 2021-11-10 DIAGNOSIS — Z23 Encounter for immunization: Secondary | ICD-10-CM | POA: Diagnosis not present

## 2021-11-23 DIAGNOSIS — G8929 Other chronic pain: Secondary | ICD-10-CM | POA: Diagnosis not present

## 2021-11-23 DIAGNOSIS — J45909 Unspecified asthma, uncomplicated: Secondary | ICD-10-CM | POA: Diagnosis not present

## 2021-11-23 DIAGNOSIS — E782 Mixed hyperlipidemia: Secondary | ICD-10-CM | POA: Diagnosis not present

## 2021-11-23 DIAGNOSIS — M069 Rheumatoid arthritis, unspecified: Secondary | ICD-10-CM | POA: Diagnosis not present

## 2021-11-23 DIAGNOSIS — R609 Edema, unspecified: Secondary | ICD-10-CM | POA: Diagnosis not present

## 2021-11-23 DIAGNOSIS — G4700 Insomnia, unspecified: Secondary | ICD-10-CM | POA: Diagnosis not present

## 2021-11-23 DIAGNOSIS — Z0000 Encounter for general adult medical examination without abnormal findings: Secondary | ICD-10-CM | POA: Diagnosis not present

## 2021-11-23 DIAGNOSIS — L989 Disorder of the skin and subcutaneous tissue, unspecified: Secondary | ICD-10-CM | POA: Diagnosis not present

## 2021-11-23 DIAGNOSIS — E039 Hypothyroidism, unspecified: Secondary | ICD-10-CM | POA: Diagnosis not present

## 2021-11-23 DIAGNOSIS — F419 Anxiety disorder, unspecified: Secondary | ICD-10-CM | POA: Diagnosis not present

## 2021-11-23 DIAGNOSIS — Z23 Encounter for immunization: Secondary | ICD-10-CM | POA: Diagnosis not present

## 2021-12-14 DIAGNOSIS — M0579 Rheumatoid arthritis with rheumatoid factor of multiple sites without organ or systems involvement: Secondary | ICD-10-CM | POA: Diagnosis not present

## 2021-12-14 DIAGNOSIS — Z79899 Other long term (current) drug therapy: Secondary | ICD-10-CM | POA: Diagnosis not present

## 2022-01-07 DIAGNOSIS — R300 Dysuria: Secondary | ICD-10-CM | POA: Diagnosis not present

## 2022-01-07 DIAGNOSIS — N3001 Acute cystitis with hematuria: Secondary | ICD-10-CM | POA: Diagnosis not present

## 2022-01-29 DIAGNOSIS — Z79899 Other long term (current) drug therapy: Secondary | ICD-10-CM | POA: Diagnosis not present

## 2022-01-29 DIAGNOSIS — M0579 Rheumatoid arthritis with rheumatoid factor of multiple sites without organ or systems involvement: Secondary | ICD-10-CM | POA: Diagnosis not present

## 2022-03-15 DIAGNOSIS — M0579 Rheumatoid arthritis with rheumatoid factor of multiple sites without organ or systems involvement: Secondary | ICD-10-CM | POA: Diagnosis not present

## 2022-03-29 DIAGNOSIS — Z20822 Contact with and (suspected) exposure to covid-19: Secondary | ICD-10-CM | POA: Diagnosis not present

## 2022-03-29 DIAGNOSIS — N390 Urinary tract infection, site not specified: Secondary | ICD-10-CM | POA: Diagnosis not present

## 2022-03-29 DIAGNOSIS — J320 Chronic maxillary sinusitis: Secondary | ICD-10-CM | POA: Diagnosis not present

## 2022-03-29 DIAGNOSIS — R519 Headache, unspecified: Secondary | ICD-10-CM | POA: Diagnosis not present

## 2022-04-12 DIAGNOSIS — R5383 Other fatigue: Secondary | ICD-10-CM | POA: Diagnosis not present

## 2022-04-12 DIAGNOSIS — Z79899 Other long term (current) drug therapy: Secondary | ICD-10-CM | POA: Diagnosis not present

## 2022-04-12 DIAGNOSIS — M0579 Rheumatoid arthritis with rheumatoid factor of multiple sites without organ or systems involvement: Secondary | ICD-10-CM | POA: Diagnosis not present

## 2022-05-15 DIAGNOSIS — M0579 Rheumatoid arthritis with rheumatoid factor of multiple sites without organ or systems involvement: Secondary | ICD-10-CM | POA: Diagnosis not present

## 2022-05-28 DIAGNOSIS — Z79899 Other long term (current) drug therapy: Secondary | ICD-10-CM | POA: Diagnosis not present

## 2022-05-28 DIAGNOSIS — M797 Fibromyalgia: Secondary | ICD-10-CM | POA: Diagnosis not present

## 2022-05-28 DIAGNOSIS — Z6838 Body mass index (BMI) 38.0-38.9, adult: Secondary | ICD-10-CM | POA: Diagnosis not present

## 2022-05-28 DIAGNOSIS — M1991 Primary osteoarthritis, unspecified site: Secondary | ICD-10-CM | POA: Diagnosis not present

## 2022-05-28 DIAGNOSIS — E669 Obesity, unspecified: Secondary | ICD-10-CM | POA: Diagnosis not present

## 2022-05-28 DIAGNOSIS — M0579 Rheumatoid arthritis with rheumatoid factor of multiple sites without organ or systems involvement: Secondary | ICD-10-CM | POA: Diagnosis not present

## 2022-07-13 IMAGING — MG DIGITAL SCREENING BILAT W/ TOMO W/ CAD
8 of 14 series · 8 of 40 positions shown · non-contrast
Comparison: None.

CLINICAL DATA: Screening. New baseline mammogram.

EXAM:
DIGITAL SCREENING BILATERAL MAMMOGRAM WITH TOMO AND CAD

[R MLO synth-2D (1 of 2)]
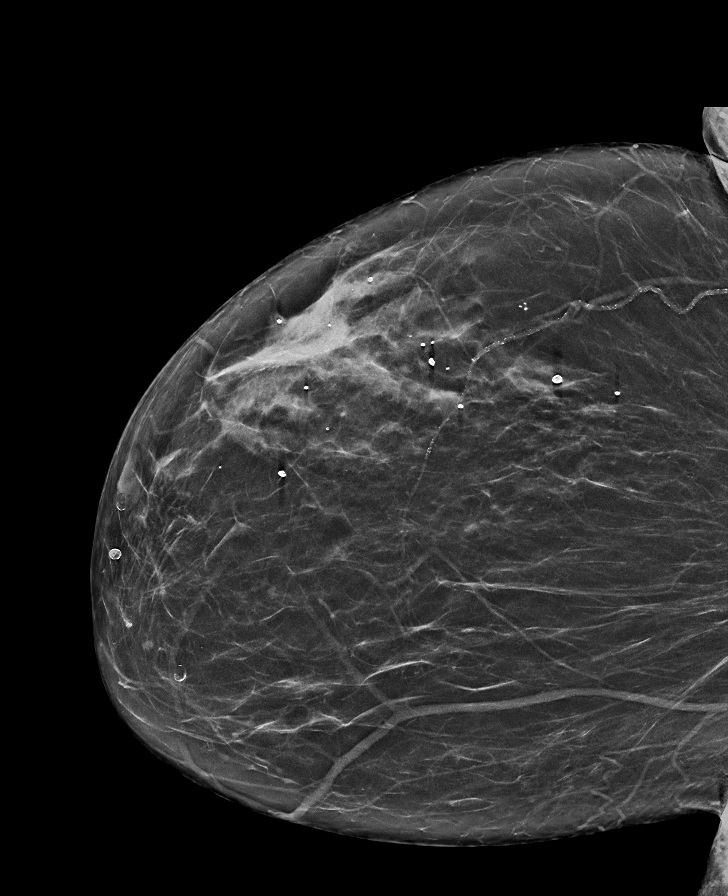

[R CC synth-2D]
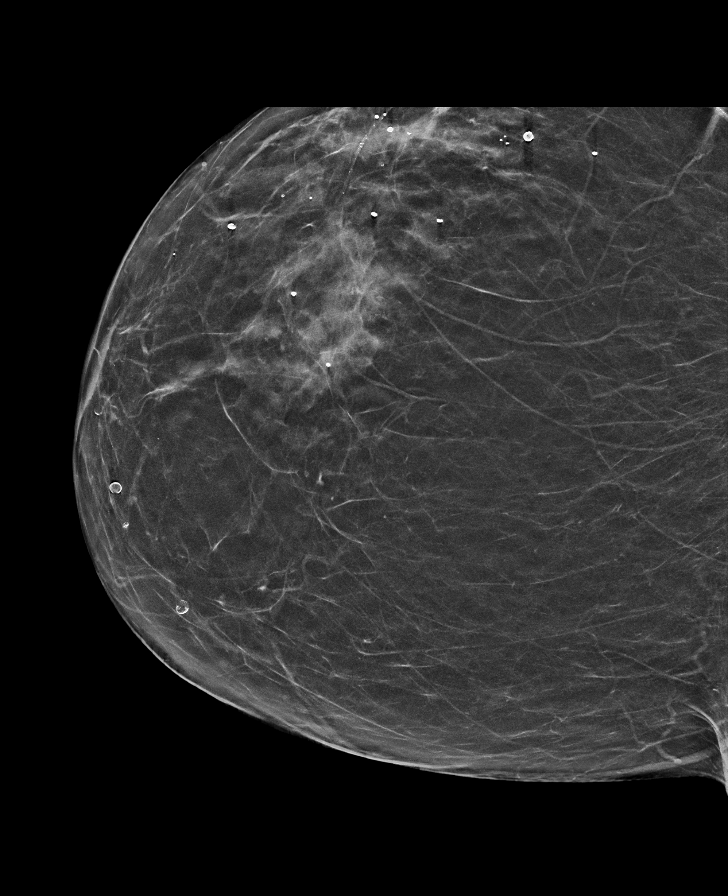

[R XCCL synth-2D]
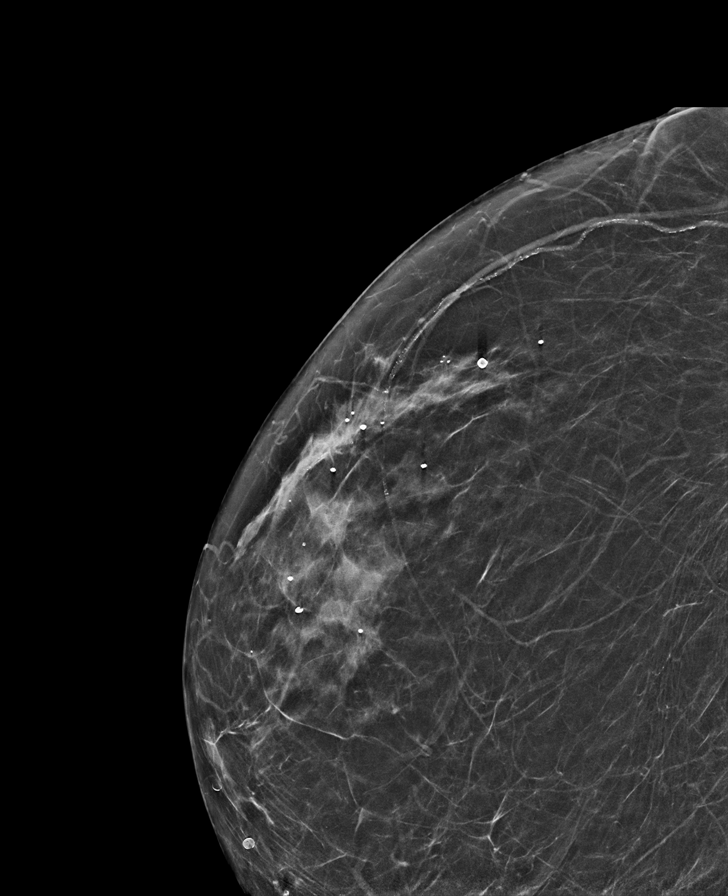

[R CV synth-2D]
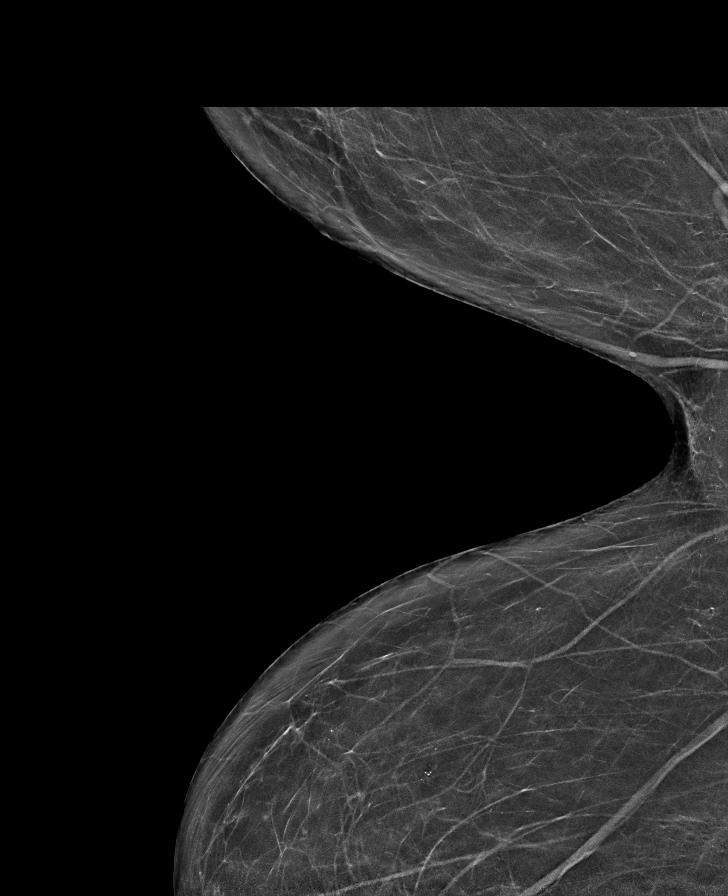

[L CC synth-2D]
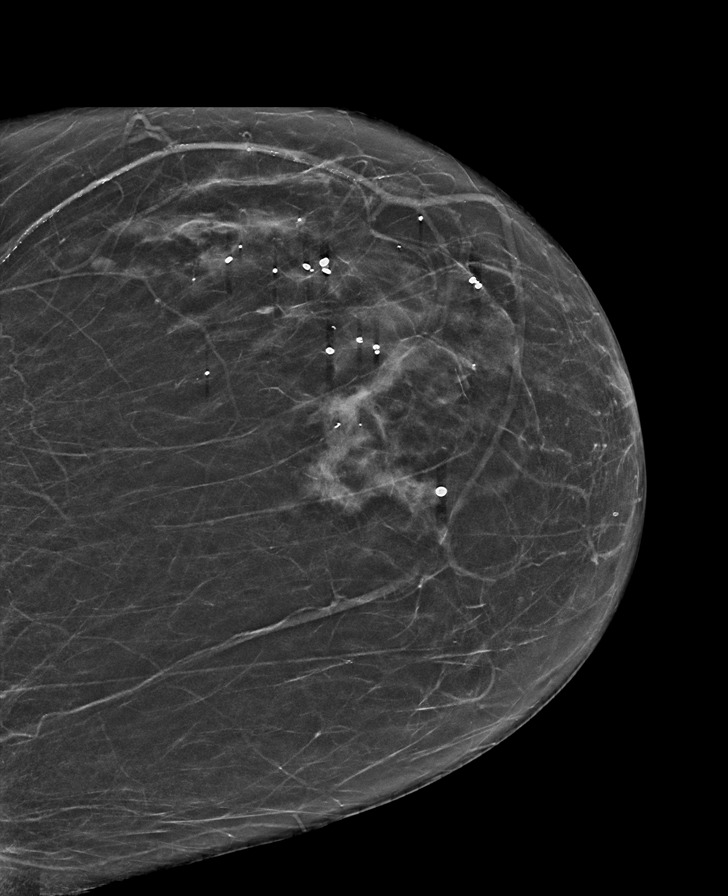

[L MLO synth-2D]
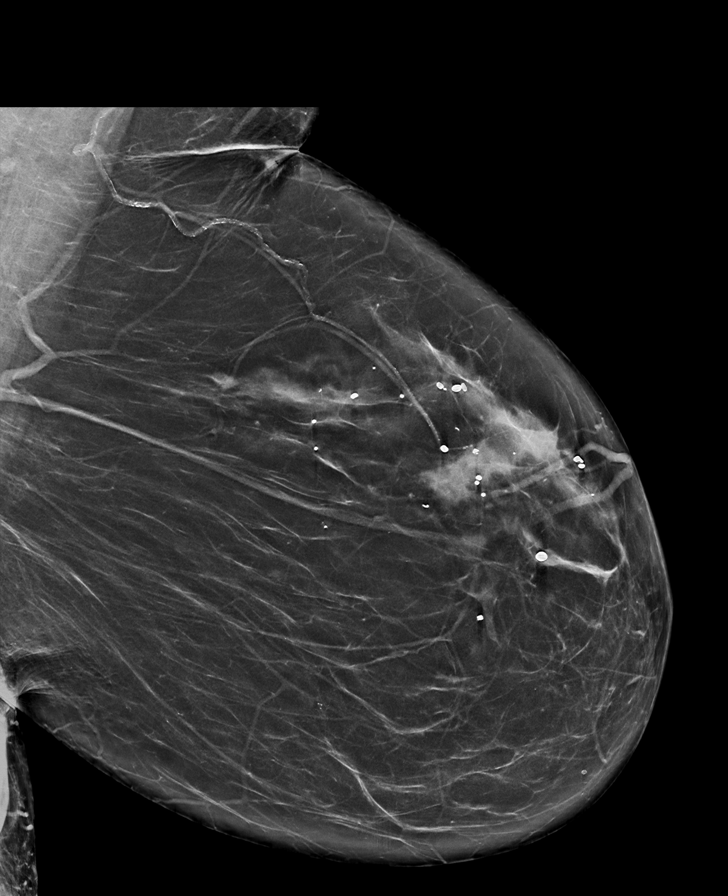

[R MLO synth-2D (2 of 2)]
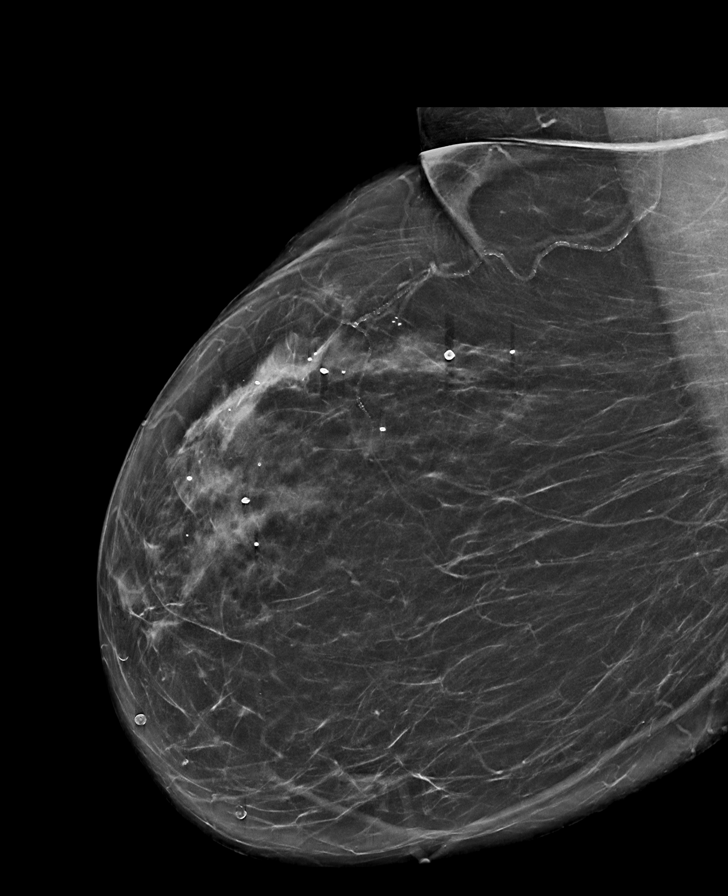

[R CC tomo · tomo slice 31/62.0]
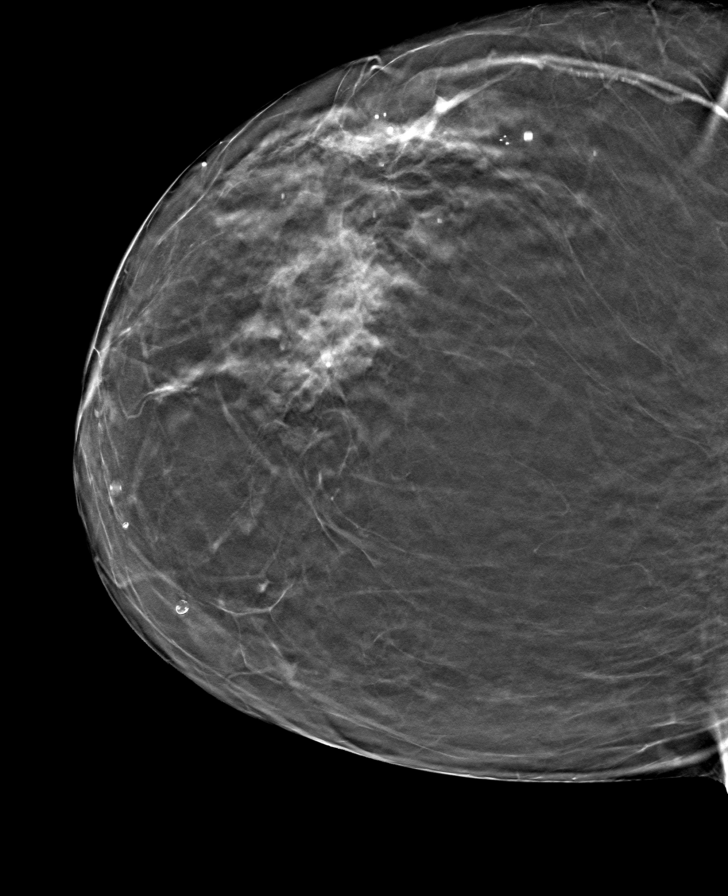

[8 of 40 positions shown; findings below may reference images not displayed]

ACR Breast Density Category b: There are scattered areas of
fibroglandular density.
FINDINGS: There are no findings suspicious for malignancy. Images were
processed with CAD.
IMPRESSION: No mammographic evidence of malignancy. A result letter of this
screening mammogram will be mailed directly to the patient.

RECOMMENDATION:
Screening mammogram in one year. (Code:T6-L-12K)

BI-RADS CATEGORY  1: Negative.

## 2022-07-19 DIAGNOSIS — M0579 Rheumatoid arthritis with rheumatoid factor of multiple sites without organ or systems involvement: Secondary | ICD-10-CM | POA: Diagnosis not present

## 2022-07-19 DIAGNOSIS — Z79899 Other long term (current) drug therapy: Secondary | ICD-10-CM | POA: Diagnosis not present

## 2022-09-10 DIAGNOSIS — M0579 Rheumatoid arthritis with rheumatoid factor of multiple sites without organ or systems involvement: Secondary | ICD-10-CM | POA: Diagnosis not present

## 2022-09-11 DIAGNOSIS — F419 Anxiety disorder, unspecified: Secondary | ICD-10-CM | POA: Diagnosis not present

## 2022-09-11 DIAGNOSIS — G894 Chronic pain syndrome: Secondary | ICD-10-CM | POA: Diagnosis not present

## 2022-10-11 DIAGNOSIS — M0579 Rheumatoid arthritis with rheumatoid factor of multiple sites without organ or systems involvement: Secondary | ICD-10-CM | POA: Diagnosis not present

## 2022-10-11 DIAGNOSIS — Z79899 Other long term (current) drug therapy: Secondary | ICD-10-CM | POA: Diagnosis not present

## 2022-11-04 DIAGNOSIS — Z23 Encounter for immunization: Secondary | ICD-10-CM | POA: Diagnosis not present

## 2022-11-12 DIAGNOSIS — M0579 Rheumatoid arthritis with rheumatoid factor of multiple sites without organ or systems involvement: Secondary | ICD-10-CM | POA: Diagnosis not present

## 2022-11-27 DIAGNOSIS — E782 Mixed hyperlipidemia: Secondary | ICD-10-CM | POA: Diagnosis not present

## 2022-11-27 DIAGNOSIS — E039 Hypothyroidism, unspecified: Secondary | ICD-10-CM | POA: Diagnosis not present

## 2022-11-27 DIAGNOSIS — Z0000 Encounter for general adult medical examination without abnormal findings: Secondary | ICD-10-CM | POA: Diagnosis not present

## 2022-11-27 DIAGNOSIS — F419 Anxiety disorder, unspecified: Secondary | ICD-10-CM | POA: Diagnosis not present

## 2022-11-27 DIAGNOSIS — M797 Fibromyalgia: Secondary | ICD-10-CM | POA: Diagnosis not present

## 2022-11-27 DIAGNOSIS — F338 Other recurrent depressive disorders: Secondary | ICD-10-CM | POA: Diagnosis not present

## 2022-11-27 DIAGNOSIS — J45909 Unspecified asthma, uncomplicated: Secondary | ICD-10-CM | POA: Diagnosis not present

## 2022-11-27 DIAGNOSIS — G4700 Insomnia, unspecified: Secondary | ICD-10-CM | POA: Diagnosis not present

## 2022-11-27 DIAGNOSIS — K219 Gastro-esophageal reflux disease without esophagitis: Secondary | ICD-10-CM | POA: Diagnosis not present

## 2023-02-04 DIAGNOSIS — M0579 Rheumatoid arthritis with rheumatoid factor of multiple sites without organ or systems involvement: Secondary | ICD-10-CM | POA: Diagnosis not present

## 2023-02-07 DIAGNOSIS — M797 Fibromyalgia: Secondary | ICD-10-CM | POA: Diagnosis not present

## 2023-02-07 DIAGNOSIS — Z6838 Body mass index (BMI) 38.0-38.9, adult: Secondary | ICD-10-CM | POA: Diagnosis not present

## 2023-02-07 DIAGNOSIS — Z79899 Other long term (current) drug therapy: Secondary | ICD-10-CM | POA: Diagnosis not present

## 2023-02-07 DIAGNOSIS — E669 Obesity, unspecified: Secondary | ICD-10-CM | POA: Diagnosis not present

## 2023-02-07 DIAGNOSIS — M0579 Rheumatoid arthritis with rheumatoid factor of multiple sites without organ or systems involvement: Secondary | ICD-10-CM | POA: Diagnosis not present

## 2023-02-07 DIAGNOSIS — R5383 Other fatigue: Secondary | ICD-10-CM | POA: Diagnosis not present

## 2023-02-07 DIAGNOSIS — M1991 Primary osteoarthritis, unspecified site: Secondary | ICD-10-CM | POA: Diagnosis not present

## 2023-02-28 DIAGNOSIS — F338 Other recurrent depressive disorders: Secondary | ICD-10-CM | POA: Diagnosis not present

## 2023-02-28 DIAGNOSIS — E039 Hypothyroidism, unspecified: Secondary | ICD-10-CM | POA: Diagnosis not present

## 2023-02-28 DIAGNOSIS — M797 Fibromyalgia: Secondary | ICD-10-CM | POA: Diagnosis not present

## 2023-02-28 DIAGNOSIS — F419 Anxiety disorder, unspecified: Secondary | ICD-10-CM | POA: Diagnosis not present

## 2023-02-28 DIAGNOSIS — M069 Rheumatoid arthritis, unspecified: Secondary | ICD-10-CM | POA: Diagnosis not present

## 2023-02-28 DIAGNOSIS — G4700 Insomnia, unspecified: Secondary | ICD-10-CM | POA: Diagnosis not present

## 2023-03-05 DIAGNOSIS — M0579 Rheumatoid arthritis with rheumatoid factor of multiple sites without organ or systems involvement: Secondary | ICD-10-CM | POA: Diagnosis not present

## 2023-04-22 DIAGNOSIS — M0579 Rheumatoid arthritis with rheumatoid factor of multiple sites without organ or systems involvement: Secondary | ICD-10-CM | POA: Diagnosis not present

## 2023-04-22 DIAGNOSIS — R5383 Other fatigue: Secondary | ICD-10-CM | POA: Diagnosis not present

## 2023-04-22 DIAGNOSIS — Z79899 Other long term (current) drug therapy: Secondary | ICD-10-CM | POA: Diagnosis not present

## 2023-06-13 DIAGNOSIS — M0579 Rheumatoid arthritis with rheumatoid factor of multiple sites without organ or systems involvement: Secondary | ICD-10-CM | POA: Diagnosis not present

## 2023-07-17 DIAGNOSIS — Z79899 Other long term (current) drug therapy: Secondary | ICD-10-CM | POA: Diagnosis not present

## 2023-07-17 DIAGNOSIS — Z111 Encounter for screening for respiratory tuberculosis: Secondary | ICD-10-CM | POA: Diagnosis not present

## 2023-07-17 DIAGNOSIS — M0579 Rheumatoid arthritis with rheumatoid factor of multiple sites without organ or systems involvement: Secondary | ICD-10-CM | POA: Diagnosis not present

## 2023-07-17 DIAGNOSIS — R5383 Other fatigue: Secondary | ICD-10-CM | POA: Diagnosis not present

## 2023-07-25 DIAGNOSIS — N3281 Overactive bladder: Secondary | ICD-10-CM | POA: Diagnosis not present

## 2023-07-25 DIAGNOSIS — Z6829 Body mass index (BMI) 29.0-29.9, adult: Secondary | ICD-10-CM | POA: Diagnosis not present

## 2023-07-25 DIAGNOSIS — M797 Fibromyalgia: Secondary | ICD-10-CM | POA: Diagnosis not present

## 2023-08-12 DIAGNOSIS — Z6833 Body mass index (BMI) 33.0-33.9, adult: Secondary | ICD-10-CM | POA: Diagnosis not present

## 2023-08-12 DIAGNOSIS — M0579 Rheumatoid arthritis with rheumatoid factor of multiple sites without organ or systems involvement: Secondary | ICD-10-CM | POA: Diagnosis not present

## 2023-08-12 DIAGNOSIS — E669 Obesity, unspecified: Secondary | ICD-10-CM | POA: Diagnosis not present

## 2023-08-12 DIAGNOSIS — M797 Fibromyalgia: Secondary | ICD-10-CM | POA: Diagnosis not present

## 2023-08-12 DIAGNOSIS — M1991 Primary osteoarthritis, unspecified site: Secondary | ICD-10-CM | POA: Diagnosis not present

## 2023-08-12 DIAGNOSIS — Z79899 Other long term (current) drug therapy: Secondary | ICD-10-CM | POA: Diagnosis not present

## 2023-08-17 DIAGNOSIS — R300 Dysuria: Secondary | ICD-10-CM | POA: Diagnosis not present

## 2023-08-28 DIAGNOSIS — M0579 Rheumatoid arthritis with rheumatoid factor of multiple sites without organ or systems involvement: Secondary | ICD-10-CM | POA: Diagnosis not present

## 2023-09-25 DIAGNOSIS — M0579 Rheumatoid arthritis with rheumatoid factor of multiple sites without organ or systems involvement: Secondary | ICD-10-CM | POA: Diagnosis not present

## 2023-10-08 DIAGNOSIS — F411 Generalized anxiety disorder: Secondary | ICD-10-CM | POA: Diagnosis not present

## 2023-10-08 DIAGNOSIS — F338 Other recurrent depressive disorders: Secondary | ICD-10-CM | POA: Diagnosis not present

## 2023-10-08 DIAGNOSIS — M797 Fibromyalgia: Secondary | ICD-10-CM | POA: Diagnosis not present

## 2023-10-08 DIAGNOSIS — N3281 Overactive bladder: Secondary | ICD-10-CM | POA: Diagnosis not present

## 2023-10-08 DIAGNOSIS — J45909 Unspecified asthma, uncomplicated: Secondary | ICD-10-CM | POA: Diagnosis not present

## 2023-10-23 DIAGNOSIS — Z79899 Other long term (current) drug therapy: Secondary | ICD-10-CM | POA: Diagnosis not present

## 2023-10-23 DIAGNOSIS — M0579 Rheumatoid arthritis with rheumatoid factor of multiple sites without organ or systems involvement: Secondary | ICD-10-CM | POA: Diagnosis not present

## 2023-12-11 DIAGNOSIS — M0579 Rheumatoid arthritis with rheumatoid factor of multiple sites without organ or systems involvement: Secondary | ICD-10-CM | POA: Diagnosis not present
